# Patient Record
Sex: Male | Born: 1965
Health system: Southern US, Community
[De-identification: ages and names within clinical notes are randomized; demographics above are authoritative.]

## PROBLEM LIST (undated history)

## (undated) DIAGNOSIS — M5126 Other intervertebral disc displacement, lumbar region: Secondary | ICD-10-CM

## (undated) DIAGNOSIS — G8929 Other chronic pain: Secondary | ICD-10-CM

## (undated) DIAGNOSIS — I10 Essential (primary) hypertension: Secondary | ICD-10-CM

## (undated) DIAGNOSIS — M1990 Unspecified osteoarthritis, unspecified site: Secondary | ICD-10-CM

## (undated) DIAGNOSIS — M549 Dorsalgia, unspecified: Secondary | ICD-10-CM

## (undated) DIAGNOSIS — IMO0002 Reserved for concepts with insufficient information to code with codable children: Secondary | ICD-10-CM

## (undated) DIAGNOSIS — M329 Systemic lupus erythematosus, unspecified: Secondary | ICD-10-CM

## (undated) DIAGNOSIS — D759 Disease of blood and blood-forming organs, unspecified: Secondary | ICD-10-CM

## (undated) DIAGNOSIS — K859 Acute pancreatitis without necrosis or infection, unspecified: Secondary | ICD-10-CM

## (undated) DIAGNOSIS — G459 Transient cerebral ischemic attack, unspecified: Secondary | ICD-10-CM

## (undated) HISTORY — PX: HERNIA REPAIR: SHX51

## (undated) HISTORY — PX: LUMBAR EPIDURAL INJECTION: SHX1980

---

## 2003-01-01 ENCOUNTER — Emergency Department (HOSPITAL_COMMUNITY): Admission: EM | Admit: 2003-01-01 | Discharge: 2003-01-01 | Payer: Self-pay | Admitting: Emergency Medicine

## 2005-10-22 ENCOUNTER — Encounter: Admission: RE | Admit: 2005-10-22 | Discharge: 2005-10-22 | Payer: Self-pay | Admitting: Occupational Medicine

## 2006-03-07 ENCOUNTER — Emergency Department: Payer: Self-pay | Admitting: Emergency Medicine

## 2007-08-29 ENCOUNTER — Emergency Department (HOSPITAL_COMMUNITY): Admission: EM | Admit: 2007-08-29 | Discharge: 2007-08-29 | Payer: Self-pay | Admitting: Emergency Medicine

## 2009-06-15 ENCOUNTER — Emergency Department (HOSPITAL_COMMUNITY): Admission: EM | Admit: 2009-06-15 | Discharge: 2009-06-15 | Payer: Self-pay | Admitting: Emergency Medicine

## 2009-08-13 ENCOUNTER — Ambulatory Visit (HOSPITAL_COMMUNITY)
Admission: RE | Admit: 2009-08-13 | Discharge: 2009-08-13 | Payer: Self-pay | Source: Home / Self Care | Admitting: Family Medicine

## 2009-10-10 ENCOUNTER — Ambulatory Visit: Payer: Self-pay

## 2009-11-15 ENCOUNTER — Emergency Department (HOSPITAL_COMMUNITY): Admission: EM | Admit: 2009-11-15 | Discharge: 2009-11-15 | Payer: Self-pay | Admitting: Emergency Medicine

## 2009-11-20 ENCOUNTER — Emergency Department: Payer: Self-pay | Admitting: Emergency Medicine

## 2009-12-19 ENCOUNTER — Observation Stay (HOSPITAL_COMMUNITY): Admission: EM | Admit: 2009-12-19 | Discharge: 2009-12-19 | Payer: Self-pay | Admitting: Emergency Medicine

## 2009-12-23 ENCOUNTER — Telehealth (INDEPENDENT_AMBULATORY_CARE_PROVIDER_SITE_OTHER): Payer: Self-pay | Admitting: *Deleted

## 2009-12-24 ENCOUNTER — Ambulatory Visit: Payer: Self-pay

## 2009-12-24 ENCOUNTER — Encounter: Payer: Self-pay | Admitting: Cardiology

## 2009-12-24 ENCOUNTER — Encounter (HOSPITAL_COMMUNITY)
Admission: RE | Admit: 2009-12-24 | Discharge: 2010-01-02 | Payer: Self-pay | Source: Home / Self Care | Admitting: Cardiology

## 2009-12-24 ENCOUNTER — Ambulatory Visit: Payer: Self-pay | Admitting: Cardiology

## 2010-01-01 DIAGNOSIS — I2510 Atherosclerotic heart disease of native coronary artery without angina pectoris: Secondary | ICD-10-CM | POA: Insufficient documentation

## 2010-01-01 DIAGNOSIS — R9439 Abnormal result of other cardiovascular function study: Secondary | ICD-10-CM | POA: Insufficient documentation

## 2010-01-12 ENCOUNTER — Ambulatory Visit (HOSPITAL_COMMUNITY): Admission: RE | Admit: 2010-01-12 | Discharge: 2010-01-12 | Payer: Self-pay | Admitting: Cardiology

## 2010-01-12 ENCOUNTER — Ambulatory Visit: Payer: Self-pay

## 2010-01-12 ENCOUNTER — Encounter: Payer: Self-pay | Admitting: Cardiology

## 2010-01-12 ENCOUNTER — Ambulatory Visit: Payer: Self-pay | Admitting: Cardiology

## 2010-04-28 NOTE — Assessment & Plan Note (Signed)
Summary: Cardiology Nuclear Testing  Nuclear Med Background Indications for Stress Test: Evaluation for Ischemia, Post Hospital, Abnormal EKG  Indications Comments: 12/19/09 [facility] with CP, Abn EKG and neg enzymes.    Symptoms: Chest Pain, Chest Tightness, Diaphoresis, DOE, Fatigue, Fatigue with Exertion, Light-Headedness, Nausea, Near Syncope, Rapid HR  Symptoms Comments: L arm numbness. Last CP yesterday.   Nuclear Pre-Procedure Cardiac Risk Factors: Hypertension, Smoker Caffeine/Decaff Intake: NONE NPO After: 9:00 PM Lungs: Clear IV 0.9% NS with Angio Cath: 22g     IV Site: R Hand IV Started by: Cathlyn Parsons, RN Chest Size (in) 46-48     Height (in): 70 Weight (lb): 176 BMI: 25.34  Nuclear Med Study 1 or 2 day study:  1 day     Stress Test Type:  Treadmill/Lexiscan Reading MD:  Willa Rough, MD     Referring MD:  J. Hochrein Resting Radionuclide:  Technetium 49m Tetrofosmin     Resting Radionuclide Dose:  11.0 mCi  Stress Radionuclide:  Technetium 43m Tetrofosmin     Stress Radionuclide Dose:  32.0 mCi   Stress Protocol      Max HR:  118 bpm     Predicted Max HR:  178 bpm  Max Systolic BP: 180 mm Hg     Percent Max HR:  66.29 %Rate Pressure Product:  57846  Lexiscan: 0.4 mg   Stress Test Technologist:  Irean Hong,  RN     Nuclear Technologist:  Domenic Polite, CNMT  Rest Procedure  Myocardial perfusion imaging was performed at rest 45 minutes following the intravenous administration of Technetium 29m Tetrofosmin.  Stress Procedure  The patient received IV Lexiscan 0.4 mg over 15-seconds with concurrent low level exercise and then Technetium 48m Tetrofosmin was injected at 30-seconds while the patient continued walking one more minute. There were baseline T wave changes, but  no significant changes with Lexiscan.  Quantitative spect images were obtained after a 45 minute delay.  QPS Raw Data Images:  Patient motion noted; appropriate software correction  applied. Stress Images:  Normal homogeneous uptake in all areas of the myocardium. Rest Images:  Normal homogeneous uptake in all areas of the myocardium. Subtraction (SDS):  No evidence of ischemia. Transient Ischemic Dilatation:  1.05  (Normal <1.22)  Lung/Heart Ratio:  .25  (Normal <0.45)  Quantitative Gated Spect Images QGS EDV:  166 ml QGS ESV:  88 ml QGS EF:  47 % QGS cine images:  Some decreased motion of the septum  Findings Low risk nuclear study      Overall Impression  Exercise Capacity: Lexiscan with low level exercise BP Response: Normal blood pressure response. Clinical Symptoms: SOB ECG Impression: No significant ST segment change suggestive of ischemia. Overall Impression Comments: There is no scar or ischemia. There is mild reduction of the EF.  Appended Document: Nuclear  needs echo EF 47% No ischemia but he needs an echo for the slightly reduced EF.  Appended Document: Cardiology Nuclear Testing pt aware of the results and aware he will be called for an appt for a 2 D Echo

## 2010-04-28 NOTE — Progress Notes (Signed)
Summary: nuc pre procedure  Phone Note Outgoing Call Call back at Home Phone [phone]   Call placed by: Cathlyn Parsons RN,  December 23, 2009 3:33 PM Call placed to: Patient Reason for Call: Confirm/change Appt Summary of Call: Reviewed information on [employer] Information Sheet (see scanned document for further details).  Spoke with patient.      Nuclear Med Background Indications for Stress Test: Evaluation for Ischemia, Post Hospital, Abnormal EKG  Indications Comments: 12/19/09 [facility] with CP, Abn EKG and neg enzymes.    Symptoms: Chest Tightness, Diaphoresis  Symptoms Comments: L arm numbness   Nuclear Pre-Procedure Cardiac Risk Factors: Hypertension, Smoker

## 2010-04-28 NOTE — Miscellaneous (Signed)
Summary: 2 D echo order  Clinical Lists Changes  Problems: Added new problem of LEFT VENTRICULAR FUNCTION, DECREASED (ICD-429.2) Added new problem of OTH NONSPECIFIC ABNORM CV SYSTEM FUNCTION STUDY (ICD-794.39) Orders: Added new Referral order of Echocardiogram (Echo) - Signed

## 2010-06-11 LAB — POCT I-STAT, CHEM 8
Calcium, Ion: 1.22 mmol/L (ref 1.12–1.32)
Creatinine, Ser: 1.1 mg/dL (ref 0.4–1.5)
Glucose, Bld: 142 mg/dL — ABNORMAL HIGH (ref 70–99)
Potassium: 4.1 mEq/L (ref 3.5–5.1)
Sodium: 137 mEq/L (ref 135–145)

## 2010-06-11 LAB — COMPREHENSIVE METABOLIC PANEL
AST: 28 U/L (ref 0–37)
Albumin: 4.1 g/dL (ref 3.5–5.2)
BUN: 11 mg/dL (ref 6–23)
Calcium: 9.5 mg/dL (ref 8.4–10.5)
Chloride: 99 mEq/L (ref 96–112)
GFR calc Af Amer: >60 mL/min (ref >60)

## 2010-06-11 LAB — CK TOTAL AND CKMB (NOT AT ARMC)
CK, MB: 1.4 ng/mL (ref 0.3–4.0)
Relative Index: 0.5 (ref 0.0–2.5)

## 2010-06-11 LAB — CBC
MCH: 30.9 pg (ref 26.0–34.0)
MCHC: 34.5 g/dL (ref 30.0–36.0)
MCV: 86.7 fL (ref 78.0–100.0)
MCV: 88.7 fL (ref 78.0–100.0)
Platelets: 274 10*3/uL (ref 150–400)
RDW: 12.8 % (ref 11.5–15.5)
WBC: 8.1 10*3/uL (ref 4.0–10.5)

## 2010-06-11 LAB — DIFFERENTIAL
Basophils Absolute: 0 10*3/uL (ref 0.0–0.1)
Basophils Relative: 1 % (ref 0–1)
Eosinophils Absolute: 0 10*3/uL (ref 0.0–0.7)
Monocytes Absolute: 0.4 10*3/uL (ref 0.1–1.0)
Neutro Abs: 6.2 10*3/uL (ref 1.7–7.7)
Neutrophils Relative %: 76 % (ref 43–77)

## 2010-06-11 LAB — CARDIAC PANEL(CRET KIN+CKTOT+MB+TROPI)
CK, MB: 1.6 ng/mL (ref 0.3–4.0)
Relative Index: 0.7 (ref 0.0–2.5)
Total CK: 233 U/L — ABNORMAL HIGH (ref 7–232)
Troponin I: 0.01 ng/mL (ref 0.00–0.06)

## 2010-06-11 LAB — POCT CARDIAC MARKERS
CKMB, poc: <1 ng/mL — ABNORMAL LOW (ref 1.0–8.0)
CKMB, poc: <1 ng/mL — ABNORMAL LOW (ref 1.0–8.0)
Myoglobin, poc: 54 ng/mL (ref 12–200)
Myoglobin, poc: 68.6 ng/mL (ref 12–200)
Troponin i, poc: <0.05 ng/mL (ref 0.00–0.09)

## 2010-06-11 LAB — PROTIME-INR: Prothrombin Time: 12.6 seconds (ref 11.6–15.2)

## 2010-06-11 LAB — D-DIMER, QUANTITATIVE: D-Dimer, Quant: <0.22 ug/mL-FEU (ref 0.00–0.48)

## 2010-06-22 LAB — POCT I-STAT, CHEM 8
Calcium, Ion: 1.11 mmol/L — ABNORMAL LOW (ref 1.12–1.32)
Glucose, Bld: 90 mg/dL (ref 70–99)
HCT: 45 % (ref 39.0–52.0)
Hemoglobin: 15.3 g/dL (ref 13.0–17.0)
Sodium: 141 mEq/L (ref 135–145)
TCO2: 30 mmol/L (ref 0–100)

## 2010-06-22 LAB — POCT CARDIAC MARKERS: Myoglobin, poc: 59 ng/mL (ref 12–200)

## 2010-07-29 ENCOUNTER — Telehealth: Payer: Self-pay | Admitting: Cardiology

## 2010-07-29 NOTE — Telephone Encounter (Signed)
Echo,Stress,12 faxed to [facility] @ [phone]  07/29/10/km

## 2011-09-27 ENCOUNTER — Ambulatory Visit: Payer: Self-pay

## 2011-10-24 ENCOUNTER — Emergency Department: Payer: Self-pay | Admitting: *Deleted

## 2011-12-26 ENCOUNTER — Emergency Department: Payer: Self-pay | Admitting: Emergency Medicine

## 2012-02-06 ENCOUNTER — Emergency Department: Payer: Self-pay | Admitting: Emergency Medicine

## 2012-09-09 ENCOUNTER — Emergency Department: Payer: Self-pay | Admitting: Internal Medicine

## 2012-09-09 LAB — CBC WITH DIFFERENTIAL/PLATELET
Basophil %: 1.3 %
Eosinophil #: 0.3 10*3/uL (ref 0.0–0.7)
HGB: 13.7 g/dL (ref 13.0–18.0)
MCH: 30.3 pg (ref 26.0–34.0)
MCHC: 34.6 g/dL (ref 32.0–36.0)
Monocyte #: 0.7 x10 3/mm (ref 0.2–1.0)
Neutrophil #: 1.7 10*3/uL (ref 1.4–6.5)
Neutrophil %: 39.3 %
Platelet: 304 10*3/uL (ref 150–440)
RBC: 4.52 10*6/uL (ref 4.40–5.90)
RDW: 13.3 % (ref 11.5–14.5)
WBC: 4.4 10*3/uL (ref 3.8–10.6)

## 2013-02-26 ENCOUNTER — Emergency Department: Payer: Self-pay | Admitting: Emergency Medicine

## 2013-02-26 LAB — CBC WITH DIFFERENTIAL/PLATELET
Basophil #: 0 10*3/uL (ref 0.0–0.1)
Basophil %: 0.4 %
Eosinophil #: 0.1 10*3/uL (ref 0.0–0.7)
HCT: 45.6 % (ref 40.0–52.0)
Lymphocyte %: 5.2 %
MCH: 31 pg (ref 26.0–34.0)
MCHC: 34.5 g/dL (ref 32.0–36.0)
Monocyte %: 5.9 %
Neutrophil #: 8.9 10*3/uL — ABNORMAL HIGH (ref 1.4–6.5)
Platelet: 327 10*3/uL (ref 150–440)
RBC: 5.07 10*6/uL (ref 4.40–5.90)

## 2013-02-26 LAB — URINALYSIS, COMPLETE
Bilirubin,UR: NEGATIVE
Blood: NEGATIVE
Glucose,UR: NEGATIVE mg/dL (ref 0–75)
Nitrite: NEGATIVE
Ph: 5 (ref 4.5–8.0)
Protein: NEGATIVE
RBC,UR: NONE SEEN /HPF (ref 0–5)
Squamous Epithelial: 1

## 2013-02-26 LAB — COMPREHENSIVE METABOLIC PANEL
Albumin: 3.7 g/dL (ref 3.4–5.0)
Bilirubin,Total: 0.7 mg/dL (ref 0.2–1.0)
Calcium, Total: 9.2 mg/dL (ref 8.5–10.1)
Co2: 28 mmol/L (ref 21–32)
Creatinine: 1.07 mg/dL (ref 0.60–1.30)
EGFR (Non-African Amer.): >60
Glucose: 108 mg/dL — ABNORMAL HIGH (ref 65–99)
Osmolality: 276 (ref 275–301)
Potassium: 3.8 mmol/L (ref 3.5–5.1)
SGOT(AST): 37 U/L (ref 15–37)

## 2013-02-26 LAB — LIPASE, BLOOD: Lipase: 516 U/L — ABNORMAL HIGH (ref 73–393)

## 2013-02-27 LAB — AMYLASE: Amylase: 124 U/L — ABNORMAL HIGH (ref 25–115)

## 2013-06-18 ENCOUNTER — Emergency Department (HOSPITAL_COMMUNITY)
Admission: EM | Admit: 2013-06-18 | Discharge: 2013-06-19 | Disposition: A | Discharge status: Home / Self Care | Payer: Self-pay | Attending: Emergency Medicine | Admitting: Emergency Medicine

## 2013-06-18 ENCOUNTER — Encounter (HOSPITAL_COMMUNITY): Payer: Self-pay | Admitting: Emergency Medicine

## 2013-06-18 DIAGNOSIS — M549 Dorsalgia, unspecified: Secondary | ICD-10-CM

## 2013-06-18 DIAGNOSIS — R519 Headache, unspecified: Secondary | ICD-10-CM

## 2013-06-18 DIAGNOSIS — F17200 Nicotine dependence, unspecified, uncomplicated: Secondary | ICD-10-CM | POA: Insufficient documentation

## 2013-06-18 DIAGNOSIS — G8929 Other chronic pain: Secondary | ICD-10-CM | POA: Insufficient documentation

## 2013-06-18 DIAGNOSIS — I10 Essential (primary) hypertension: Secondary | ICD-10-CM | POA: Insufficient documentation

## 2013-06-18 DIAGNOSIS — Z79899 Other long term (current) drug therapy: Secondary | ICD-10-CM | POA: Insufficient documentation

## 2013-06-18 DIAGNOSIS — R51 Headache: Secondary | ICD-10-CM | POA: Insufficient documentation

## 2013-06-18 DIAGNOSIS — M545 Low back pain, unspecified: Secondary | ICD-10-CM | POA: Insufficient documentation

## 2013-06-18 HISTORY — DX: Other chronic pain: G89.29

## 2013-06-18 HISTORY — DX: Essential (primary) hypertension: I10

## 2013-06-18 HISTORY — DX: Dorsalgia, unspecified: M54.9

## 2013-06-18 NOTE — ED Notes (Signed)
Family member updated on wait time.

## 2013-06-18 NOTE — ED Notes (Signed)
Pt. Reports chronic low back pain for several years and elevated blood pressure , pt. Is not taking antihypertensive medication , pt. also stated bilateral eyes pressure .

## 2013-06-19 LAB — CBC WITH DIFFERENTIAL/PLATELET
BASOS PCT: 1 % (ref 0–1)
Basophils Absolute: 0.1 10*3/uL (ref 0.0–0.1)
EOS ABS: 0.3 10*3/uL (ref 0.0–0.7)
Eosinophils Relative: 6 % — ABNORMAL HIGH (ref 0–5)
HCT: 41.5 % (ref 39.0–52.0)
Hemoglobin: 14.8 g/dL (ref 13.0–17.0)
Lymphocytes Relative: 32 % (ref 12–46)
Lymphs Abs: 1.6 10*3/uL (ref 0.7–4.0)
MCH: 31.2 pg (ref 26.0–34.0)
MCHC: 35.7 g/dL (ref 30.0–36.0)
MCV: 87.4 fL (ref 78.0–100.0)
Monocytes Absolute: 0.7 10*3/uL (ref 0.1–1.0)
Monocytes Relative: 13 % — ABNORMAL HIGH (ref 3–12)
NEUTROS ABS: 2.4 10*3/uL (ref 1.7–7.7)
NEUTROS PCT: 48 % (ref 43–77)
PLATELETS: 297 10*3/uL (ref 150–400)
RBC: 4.75 MIL/uL (ref 4.22–5.81)
RDW: 13 % (ref 11.5–15.5)
WBC: 5 10*3/uL (ref 4.0–10.5)

## 2013-06-19 LAB — I-STAT CHEM 8, ED
BUN: 8 mg/dL (ref 6–23)
CREATININE: 1.1 mg/dL (ref 0.50–1.35)
Calcium, Ion: 1.19 mmol/L (ref 1.12–1.23)
Chloride: 100 mEq/L (ref 96–112)
GLUCOSE: 89 mg/dL (ref 70–99)
HEMATOCRIT: 44 % (ref 39.0–52.0)
HEMOGLOBIN: 15 g/dL (ref 13.0–17.0)
Potassium: 4.1 mEq/L (ref 3.7–5.3)
SODIUM: 138 meq/L (ref 137–147)
TCO2: 27 mmol/L (ref 0–100)

## 2013-06-19 MED ORDER — SODIUM CHLORIDE 0.9 % IV SOLN
INTRAVENOUS | Status: DC
  Administered 2013-06-19: 02:00:00 via INTRAVENOUS

## 2013-06-19 MED ORDER — METOCLOPRAMIDE HCL 5 MG/ML IJ SOLN
10.0000 mg | Freq: Once | INTRAMUSCULAR | Status: AC
  Administered 2013-06-19: 10 mg via INTRAVENOUS
  Filled 2013-06-19: qty 2

## 2013-06-19 MED ORDER — HYDROCHLOROTHIAZIDE 25 MG PO TABS
25.0000 mg | ORAL_TABLET | Freq: Every day | ORAL | Status: DC
  Administered 2013-06-19: 25 mg via ORAL
  Filled 2013-06-19: qty 1

## 2013-06-19 MED ORDER — DIPHENHYDRAMINE HCL 50 MG/ML IJ SOLN
25.0000 mg | Freq: Once | INTRAMUSCULAR | Status: AC
  Administered 2013-06-19: 25 mg via INTRAVENOUS
  Filled 2013-06-19: qty 1

## 2013-06-19 MED ORDER — KETOROLAC TROMETHAMINE 15 MG/ML IJ SOLN
15.0000 mg | Freq: Once | INTRAMUSCULAR | Status: AC
  Administered 2013-06-19: 15 mg via INTRAVENOUS
  Filled 2013-06-19: qty 1

## 2013-06-19 MED ORDER — HYDROCODONE-ACETAMINOPHEN 5-325 MG PO TABS: 1.0000 | ORAL_TABLET | Freq: Four times a day (QID) | ORAL | Status: DC | PRN

## 2013-06-19 MED ORDER — HYDROCHLOROTHIAZIDE 25 MG PO TABS
25.0000 mg | ORAL_TABLET | Freq: Every day | ORAL | Status: DC
Start: 2013-06-19 — End: 2015-11-07

## 2013-06-19 NOTE — ED Provider Notes (Signed)
CSN: [id number]     Arrival date & time 06/18/13  1904 History   First MD Initiated Contact with Patient 06/19/13 0046     Chief Complaint  Patient presents with  . Back Pain     (Consider location/radiation/quality/duration/timing/severity/associated sxs/prior Treatment) HPI This 48 year old male with a history of systemic lupus erythematosus on chronic methotrexate and folic acid. He is followed for this in [place]. He has a history of chronic low back pain but has not had treatment for this in several years. He is here with throbbing in his lower back which she states is radiating to his head causing him to have headache. He is very vague about when this started and when it became urgent enough to come to the ED. He describes the headache as throbbing and wrapping from his occipital region up towards his eyes. He has a history of hypertension but is not on any antihypertensives. He states he is having some mild shortness of breath. He has some upper chest wall tenderness. He denies any focal neurologic deficits. He has chronic skin pigmentation changes due to his SLE.  Past Medical History  Diagnosis Date  . Hypertension   . Back pain, chronic    Past Surgical History  Procedure Laterality Date  . Hernia repair     No family history on file. History  Substance Use Topics  . Smoking status: Current Every Day Smoker  . Smokeless tobacco: Not on file  . Alcohol Use: No    Review of Systems  All other systems reviewed and are negative.   Allergies  Review of patient's allergies indicates no known allergies.  Home Medications   Current Outpatient Rx  Name  Route  Sig  Dispense  Refill  . FOLIC ACID PO   Oral   Take 1 tablet by mouth daily.         . Methotrexate Sodium (METHOTREXATE PO)   Oral   Take 1 tablet by mouth once a week. Pt takes on fridays          BP 153/95  Pulse 60  Temp(Src) 97.8 F (36.6 C) (Oral)  Resp 18  Ht 5\' 10"  (1.778 m)  Wt 180 lb  (81.647 kg)  BMI 25.83 kg/m2  SpO2 99%  Physical Exam General: Well-developed, well-nourished male in no acute distress; appearance consistent with age of record HENT: normocephalic; atraumatic Eyes: pupils equal, round and reactive to light; extraocular muscles intact Neck: supple Heart: regular rate and rhythm; no murmurs Lungs: clear to auscultation bilaterally Chest: Upper sternal tenderness Abdomen: soft; nondistended; nontender; bowel sounds present Extremities: No deformity; full range of motion; pulses normal Neurologic: Awake, alert and oriented; motor function intact in all extremities and symmetric; no facial droop Skin: Warm and dry; patchy pigmentation changes Psychiatric: Normal mood and affect    ED Course  Procedures (including critical care time)     MDM   Nursing notes and vitals signs, including pulse oximetry, reviewed.  Summary of this visit's results, reviewed by myself:  Labs:  Results for orders placed during the hospital encounter of 06/18/13 (from the past 24 hour(s))  CBC WITH DIFFERENTIAL     Status: Abnormal   Collection Time    06/19/13  1:01 AM      Result Value Ref Range   WBC 5.0  4.0 - 10.5 K/uL   RBC 4.75  4.22 - 5.81 MIL/uL   Hemoglobin 14.8  13.0 - 17.0 g/dL   HCT 41.5  39.0 -  52.0 %   MCV 87.4  78.0 - 100.0 fL   MCH 31.2  26.0 - 34.0 pg   MCHC 35.7  30.0 - 36.0 g/dL   RDW 13.0  11.5 - 15.5 %   Platelets 297  150 - 400 K/uL   Neutrophils Relative % 48  43 - 77 %   Neutro Abs 2.4  1.7 - 7.7 K/uL   Lymphocytes Relative 32  12 - 46 %   Lymphs Abs 1.6  0.7 - 4.0 K/uL   Monocytes Relative 13 (*) 3 - 12 %   Monocytes Absolute 0.7  0.1 - 1.0 K/uL   Eosinophils Relative 6 (*) 0 - 5 %   Eosinophils Absolute 0.3  0.0 - 0.7 K/uL   Basophils Relative 1  0 - 1 %   Basophils Absolute 0.1  0.0 - 0.1 K/uL  I-STAT CHEM 8, ED     Status: None   Collection Time    06/19/13  1:15 AM      Result Value Ref Range   Sodium 138  137 - 147 mEq/L    Potassium 4.1  3.7 - 5.3 mEq/L   Chloride 100  96 - 112 mEq/L   BUN 8  6 - 23 mg/dL   Creatinine, Ser 1.10  0.50 - 1.35 mg/dL   Glucose, Bld 89  70 - 99 mg/dL   Calcium, Ion 1.19  1.12 - 1.23 mmol/L   TCO2 27  0 - 100 mmol/L   Hemoglobin 15.0  13.0 - 17.0 g/dL   HCT 44.0  39.0 - 52.0 %   3:23 AM Patient feels better after IV medications. No evidence of endorgan damage at this time. We'll start him on hydrochlorothiazide and refer him back to his primary care physician in [place].      Wynetta Fines, MD 06/19/13 [phone]

## 2014-02-02 ENCOUNTER — Emergency Department (HOSPITAL_COMMUNITY)
Admission: EM | Admit: 2014-02-02 | Discharge: 2014-02-02 | Disposition: A | Discharge status: Home / Self Care | Payer: Medicaid Other | Attending: Emergency Medicine | Admitting: Emergency Medicine

## 2014-02-02 ENCOUNTER — Encounter (HOSPITAL_COMMUNITY): Payer: Self-pay | Admitting: *Deleted

## 2014-02-02 ENCOUNTER — Emergency Department (HOSPITAL_COMMUNITY): Payer: Medicaid Other

## 2014-02-02 DIAGNOSIS — Z720 Tobacco use: Secondary | ICD-10-CM | POA: Insufficient documentation

## 2014-02-02 DIAGNOSIS — R1013 Epigastric pain: Secondary | ICD-10-CM | POA: Insufficient documentation

## 2014-02-02 DIAGNOSIS — R079 Chest pain, unspecified: Secondary | ICD-10-CM | POA: Diagnosis present

## 2014-02-02 DIAGNOSIS — Z79899 Other long term (current) drug therapy: Secondary | ICD-10-CM | POA: Insufficient documentation

## 2014-02-02 DIAGNOSIS — G8929 Other chronic pain: Secondary | ICD-10-CM | POA: Diagnosis not present

## 2014-02-02 DIAGNOSIS — Z862 Personal history of diseases of the blood and blood-forming organs and certain disorders involving the immune mechanism: Secondary | ICD-10-CM | POA: Diagnosis not present

## 2014-02-02 DIAGNOSIS — I10 Essential (primary) hypertension: Secondary | ICD-10-CM | POA: Insufficient documentation

## 2014-02-02 DIAGNOSIS — R0789 Other chest pain: Secondary | ICD-10-CM | POA: Diagnosis not present

## 2014-02-02 DIAGNOSIS — R52 Pain, unspecified: Secondary | ICD-10-CM

## 2014-02-02 HISTORY — DX: Disease of blood and blood-forming organs, unspecified: D75.9

## 2014-02-02 LAB — CBC
HCT: 37.7 % — ABNORMAL LOW (ref 39.0–52.0)
HEMOGLOBIN: 13.4 g/dL (ref 13.0–17.0)
MCH: 30.1 pg (ref 26.0–34.0)
MCHC: 35.5 g/dL (ref 30.0–36.0)
MCV: 84.7 fL (ref 78.0–100.0)
Platelets: 271 10*3/uL (ref 150–400)
RBC: 4.45 MIL/uL (ref 4.22–5.81)
RDW: 11.7 % (ref 11.5–15.5)
WBC: 4.2 10*3/uL (ref 4.0–10.5)

## 2014-02-02 LAB — COMPREHENSIVE METABOLIC PANEL
ALT: 39 U/L (ref 0–53)
ANION GAP: 12 (ref 5–15)
AST: 32 U/L (ref 0–37)
Albumin: 3.7 g/dL (ref 3.5–5.2)
Alkaline Phosphatase: 65 U/L (ref 39–117)
BILIRUBIN TOTAL: 0.3 mg/dL (ref 0.3–1.2)
BUN: 11 mg/dL (ref 6–23)
CHLORIDE: 104 meq/L (ref 96–112)
CO2: 26 meq/L (ref 19–32)
Calcium: 9.4 mg/dL (ref 8.4–10.5)
Creatinine, Ser: 1.24 mg/dL (ref 0.50–1.35)
GFR, EST AFRICAN AMERICAN: 79 mL/min — AB (ref >90)
GFR, EST NON AFRICAN AMERICAN: 68 mL/min — AB (ref >90)
Glucose, Bld: 93 mg/dL (ref 70–99)
POTASSIUM: 4.1 meq/L (ref 3.7–5.3)
SODIUM: 142 meq/L (ref 137–147)
Total Protein: 6.6 g/dL (ref 6.0–8.3)

## 2014-02-02 LAB — TROPONIN I
Troponin I: <0.3 ng/mL (ref <0.30)
Troponin I: <0.3 ng/mL (ref <0.30)

## 2014-02-02 LAB — LIPASE, BLOOD: Lipase: 30 U/L (ref 11–59)

## 2014-02-02 LAB — I-STAT TROPONIN, ED: TROPONIN I, POC: 0 ng/mL (ref 0.00–0.08)

## 2014-02-02 MED ORDER — PANTOPRAZOLE SODIUM 20 MG PO TBEC: 20.0000 mg | DELAYED_RELEASE_TABLET | Freq: Every day | ORAL | Status: DC

## 2014-02-02 MED ORDER — HYDROCODONE-ACETAMINOPHEN 5-325 MG PO TABS: 1.0000 | ORAL_TABLET | Freq: Four times a day (QID) | ORAL | Status: DC | PRN

## 2014-02-02 MED ORDER — TRAMADOL HCL 50 MG PO TABS
50.0000 mg | ORAL_TABLET | Freq: Once | ORAL | Status: DC
  Filled 2014-02-02: qty 1

## 2014-02-02 MED ORDER — PANTOPRAZOLE SODIUM 40 MG PO TBEC
40.0000 mg | DELAYED_RELEASE_TABLET | Freq: Every day | ORAL | Status: DC
  Administered 2014-02-02: 40 mg via ORAL
  Filled 2014-02-02: qty 1

## 2014-02-02 MED ORDER — IBUPROFEN 200 MG PO TABS
400.0000 mg | ORAL_TABLET | Freq: Once | ORAL | Status: AC
  Administered 2014-02-02: 400 mg via ORAL
  Filled 2014-02-02: qty 2

## 2014-02-02 MED ORDER — HYDROCODONE-ACETAMINOPHEN 5-325 MG PO TABS
2.0000 | ORAL_TABLET | Freq: Once | ORAL | Status: AC
  Administered 2014-02-02: 2 via ORAL
  Filled 2014-02-02: qty 2

## 2014-02-02 NOTE — ED Provider Notes (Signed)
CSN: [id number]     Arrival date & time 02/02/14  0930 History   First MD Initiated Contact with Patient 02/02/14 1007     Chief Complaint  Patient presents with  . Abdominal Pain  . Chest Pain     (Consider location/radiation/quality/duration/timing/severity/associated sxs/prior Treatment) Patient is a 48 y.o. male presenting with chest pain. The history is provided by the patient. No language interpreter was used.  Chest Pain Pain location:  Epigastric and substernal area Pain quality: aching   Pain radiates to:  Does not radiate Pain radiates to the back: no   Pain severity:  Moderate Onset quality:  Gradual Duration:  1 week Timing:  Constant Progression:  Worsening Chronicity:  New Relieved by:  Nothing Worsened by:  Nothing tried Associated symptoms: no back pain, no shortness of breath and not vomiting   Risk factors: male sex   Pt reports pain in chest for the past week.   Pt reports discomfort in upper abdominal area as well.  No fever, no chills  Past Medical History  Diagnosis Date  . Hypertension   . Back pain, chronic   . Blood disorder     LPC, treated at [facility]   Past Surgical History  Procedure Laterality Date  . Hernia repair     History reviewed. No pertinent family history. History  Substance Use Topics  . Smoking status: Light Tobacco Smoker    Types: Cigarettes  . Smokeless tobacco: Not on file  . Alcohol Use: No    Review of Systems  Respiratory: Negative for shortness of breath.   Cardiovascular: Positive for chest pain.  Gastrointestinal: Negative for vomiting.  Musculoskeletal: Negative for back pain.  All other systems reviewed and are negative.     Allergies  Review of patient's allergies indicates no known allergies.  Home Medications   Prior to Admission medications   Medication Sig Start Date End Date Taking? Authorizing Provider  FOLIC ACID PO Take 1 tablet by mouth daily.    Historical Provider, MD  hydrochlorothiazide  (HYDRODIURIL) 25 MG tablet Take 1 tablet (25 mg total) by mouth daily. 06/19/13   Karen Chafe Molpus, MD  HYDROcodone-acetaminophen (NORCO) 5-325 MG per tablet Take 1-2 tablets by mouth every 6 (six) hours as needed (for pain). 06/19/13   Karen Chafe Molpus, MD  Methotrexate Sodium (METHOTREXATE PO) Take 1 tablet by mouth once a week. Pt takes on fridays    Historical Provider, MD   BP 173/108 mmHg  Pulse 57  Temp(Src) 97.8 F (36.6 C) (Oral)  Resp 16  SpO2 100% Physical Exam  Constitutional: He appears well-developed and well-nourished.  HENT:  Head: Normocephalic.  Right Ear: External ear normal.  Left Ear: External ear normal.  Mouth/Throat: Oropharynx is clear and moist.  Eyes: Conjunctivae are normal. Pupils are equal, round, and reactive to light.  Neck: Normal range of motion. Neck supple.  Cardiovascular: Normal rate.   Pulmonary/Chest: Effort normal.  Abdominal: Soft.  Musculoskeletal: Normal range of motion.  Neurological: He is alert.  Skin: Skin is warm.  Psychiatric: He has a normal mood and affect.  Nursing note and vitals reviewed.   ED Course  Procedures (including critical care time) Labs Review Labs Reviewed  CBC  LIPASE, BLOOD  COMPREHENSIVE METABOLIC PANEL  TROPONIN I  I-STAT TROPOININ, ED    Imaging Review No results found.   EKG Interpretation   Date/Time:  Saturday February 02 2014 09:40:38 EST Ventricular Rate:  53 PR Interval:  199 QRS Duration:  107 QT Interval:  419 QTC Calculation: 393 R Axis:   29 Text Interpretation:  Sinus rhythm Borderline T wave abnormalities  Baseline wander in lead(s) I II III aVR aVL aVF V3 V6 No significant  change since last tracing Confirmed by Ashok Cordia  MD, Lennette Bihari ([id number]) on  02/02/2014 11:05:26 AM      Results for orders placed or performed during the hospital encounter of 02/02/14  CBC  Result Value Ref Range   WBC 4.2 4.0 - 10.5 K/uL   RBC 4.45 4.22 - 5.81 MIL/uL   Hemoglobin 13.4 13.0 - 17.0 g/dL   HCT 37.7  (L) 39.0 - 52.0 %   MCV 84.7 78.0 - 100.0 fL   MCH 30.1 26.0 - 34.0 pg   MCHC 35.5 30.0 - 36.0 g/dL   RDW 11.7 11.5 - 15.5 %   Platelets 271 150 - 400 K/uL  Lipase, blood  Result Value Ref Range   Lipase 30 11 - 59 U/L  Comprehensive metabolic panel  Result Value Ref Range   Sodium 142 137 - 147 mEq/L   Potassium 4.1 3.7 - 5.3 mEq/L   Chloride 104 96 - 112 mEq/L   CO2 26 19 - 32 mEq/L   Glucose, Bld 93 70 - 99 mg/dL   BUN 11 6 - 23 mg/dL   Creatinine, Ser 1.24 0.50 - 1.35 mg/dL   Calcium 9.4 8.4 - 10.5 mg/dL   Total Protein 6.6 6.0 - 8.3 g/dL   Albumin 3.7 3.5 - 5.2 g/dL   AST 32 0 - 37 U/L   ALT 39 0 - 53 U/L   Alkaline Phosphatase 65 39 - 117 U/L   Total Bilirubin 0.3 0.3 - 1.2 mg/dL   GFR calc non Af Amer 68 (L) >90 mL/min   GFR calc Af Amer 79 (L) >90 mL/min   Anion gap 12 5 - 15  Troponin I  Result Value Ref Range   Troponin I <0.30 <0.30 ng/mL  Troponin I  Result Value Ref Range   Troponin I <0.30 <0.30 ng/mL  I-stat troponin, ED (not at [facility])  Result Value Ref Range   Troponin i, poc 0.00 0.00 - 0.08 ng/mL   Comment 3           Dg Chest 2 View  02/02/2014   CLINICAL DATA:  Chest pain.  EXAM: CHEST  2 VIEW  COMPARISON:  None.  FINDINGS: Normal heart size and mediastinal contours. No acute infiltrate or edema. No effusion or pneumothorax. No acute osseous findings.  IMPRESSION: No active cardiopulmonary disease.   Electronically Signed   By: Jorje Guild M.D.   On: 02/02/2014 11:08     MDM troponin negative x 2.    I will treat with priotonix and hydrocodone for pain.   Pt advised top see his MD for recheck in 2-3 days   Final diagnoses:  Pain  Chest pain, atypical    Hydrocodone protonix   Fransico Meadow, PA-C 02/02/14 [place], MD 02/03/14 [phone]

## 2014-02-02 NOTE — ED Notes (Signed)
Pt reports he is currently being treated for blood disorder LPC at [facility]. Reports central upper abdominal x1 week, reports nausea, denies vomiting. Reports slight SOB, able to speak in full sentences.

## 2014-02-02 NOTE — ED Notes (Addendum)
Pt ambulatory upon DC. PT reports that he is leaving with ALL belongings he arrived with. Pt was advised to follow up with PCP in the next few days. Family driving pt home.

## 2014-02-02 NOTE — Discharge Instructions (Signed)
Abdominal Pain °Many things can cause abdominal pain. Usually, abdominal pain is not caused by a disease and will improve without treatment. It can often be observed and treated at home. Your health care provider will do a physical exam and possibly order blood tests and X-rays to help determine the seriousness of your pain. However, in many cases, more time must pass before a clear cause of the pain can be found. Before that point, your health care provider may not know if you need more testing or further treatment. °HOME CARE INSTRUCTIONS  °Monitor your abdominal pain for any changes. The following actions may help to alleviate any discomfort you are experiencing: °· Only take over-the-counter or prescription medicines as directed by your health care provider. °· Do not take laxatives unless directed to do so by your health care provider. °· Try a clear liquid diet (broth, tea, or water) as directed by your health care provider. Slowly move to a bland diet as tolerated. °SEEK MEDICAL CARE IF: °· You have unexplained abdominal pain. °· You have abdominal pain associated with nausea or diarrhea. °· You have pain when you urinate or have a bowel movement. °· You experience abdominal pain that wakes you in the night. °· You have abdominal pain that is worsened or improved by eating food. °· You have abdominal pain that is worsened with eating fatty foods. °· You have a fever. °SEEK IMMEDIATE MEDICAL CARE IF:  °· Your pain does not go away within 2 hours. °· You keep throwing up (vomiting). °· Your pain is felt only in portions of the abdomen, such as the right side or the left lower portion of the abdomen. °· You pass bloody or black tarry stools. °MAKE SURE YOU: °· Understand these instructions.   °· Will watch your condition.   °· Will get help right away if you are not doing well or get worse.   °Document Released: 12/23/2004 Document Revised: 03/20/2013 Document Reviewed: 11/22/2012 °ExitCare® Patient Information  ©2015 [employer]. This information is not intended to replace advice given to you by your health care provider. Make sure you discuss any questions you have with your health care provider. ° °Chest Pain (Nonspecific) °It is often hard to give a specific diagnosis for the cause of chest pain. There is always a chance that your pain could be related to something serious, such as a heart attack or a blood clot in the lungs. You need to follow up with your health care provider for further evaluation. °CAUSES  °· Heartburn. °· Pneumonia or bronchitis. °· Anxiety or stress. °· Inflammation around your heart (pericarditis) or lung (pleuritis or pleurisy). °· A blood clot in the lung. °· A collapsed lung (pneumothorax). It can develop suddenly on its own (spontaneous pneumothorax) or from trauma to the chest. °· Shingles infection (herpes zoster virus). °The chest wall is composed of bones, muscles, and cartilage. Any of these can be the source of the pain. °· The bones can be bruised by injury. °· The muscles or cartilage can be strained by coughing or overwork. °· The cartilage can be affected by inflammation and become sore (costochondritis). °DIAGNOSIS  °Lab tests or other studies may be needed to find the cause of your pain. Your health care provider may have you take a test called an ambulatory electrocardiogram (ECG). An ECG records your heartbeat patterns over a 24-hour period. You may also have other tests, such as: °· Transthoracic echocardiogram (TTE). During echocardiography, sound waves are used to evaluate how blood   flows through your heart. °· Transesophageal echocardiogram (TEE). °· Cardiac monitoring. This allows your health care provider to monitor your heart rate and rhythm in real time. °· Holter monitor. This is a portable device that records your heartbeat and can help diagnose heart arrhythmias. It allows your health care provider to track your heart activity for several days, if needed. °· Stress  tests by exercise or by giving medicine that makes the heart beat faster. °TREATMENT  °· Treatment depends on what may be causing your chest pain. Treatment may include: °¨ Acid blockers for heartburn. °¨ Anti-inflammatory medicine. °¨ Pain medicine for inflammatory conditions. °¨ Antibiotics if an infection is present. °· You may be advised to change lifestyle habits. This includes stopping smoking and avoiding alcohol, caffeine, and chocolate. °· You may be advised to keep your head raised (elevated) when sleeping. This reduces the chance of acid going backward from your stomach into your esophagus. °Most of the time, nonspecific chest pain will improve within 2-3 days with rest and mild pain medicine.  °HOME CARE INSTRUCTIONS  °· If antibiotics were prescribed, take them as directed. Finish them even if you start to feel better. °· For the next few days, avoid physical activities that bring on chest pain. Continue physical activities as directed. °· Do not use any tobacco products, including cigarettes, chewing tobacco, or electronic cigarettes. °· Avoid drinking alcohol. °· Only take medicine as directed by your health care provider. °· Follow your health care provider's suggestions for further testing if your chest pain does not go away. °· Keep any follow-up appointments you made. If you do not go to an appointment, you could develop lasting (chronic) problems with pain. If there is any problem keeping an appointment, call to reschedule. °SEEK MEDICAL CARE IF:  °· Your chest pain does not go away, even after treatment. °· You have a rash with blisters on your chest. °· You have a fever. °SEEK IMMEDIATE MEDICAL CARE IF:  °· You have increased chest pain or pain that spreads to your arm, neck, jaw, back, or abdomen. °· You have shortness of breath. °· You have an increasing cough, or you cough up blood. °· You have severe back or abdominal pain. °· You feel nauseous or vomit. °· You have severe weakness. °· You  faint. °· You have chills. °This is an emergency. Do not wait to see if the pain will go away. Get medical help at once. Call your local emergency services (911 in [place] Do not drive yourself to the hospital. °MAKE SURE YOU:  °· Understand these instructions. °· Will watch your condition. °· Will get help right away if you are not doing well or get worse. °Document Released: 12/23/2004 Document Revised: 03/20/2013 Document Reviewed: 10/19/2007 °ExitCare® Patient Information ©2015 [employer]. This information is not intended to replace advice given to you by your health care provider. Make sure you discuss any questions you have with your health care provider. ° °

## 2014-03-29 DIAGNOSIS — G459 Transient cerebral ischemic attack, unspecified: Secondary | ICD-10-CM

## 2014-03-29 HISTORY — DX: Transient cerebral ischemic attack, unspecified: G45.9

## 2014-09-12 ENCOUNTER — Ambulatory Visit: Payer: Medicaid Other | Admitting: Anesthesiology

## 2014-09-12 ENCOUNTER — Ambulatory Visit
Admission: RE | Admit: 2014-09-12 | Discharge: 2014-09-12 | Disposition: A | Discharge status: Home / Self Care | Payer: Medicaid Other | Source: Ambulatory Visit | Attending: Gastroenterology | Admitting: Gastroenterology

## 2014-09-12 ENCOUNTER — Encounter: Payer: Self-pay | Admitting: Anesthesiology

## 2014-09-12 ENCOUNTER — Encounter
Admission: RE | Disposition: A | Discharge status: Home / Self Care | Payer: Self-pay | Source: Ambulatory Visit | Attending: Gastroenterology

## 2014-09-12 DIAGNOSIS — M329 Systemic lupus erythematosus, unspecified: Secondary | ICD-10-CM | POA: Insufficient documentation

## 2014-09-12 DIAGNOSIS — K635 Polyp of colon: Secondary | ICD-10-CM | POA: Diagnosis not present

## 2014-09-12 DIAGNOSIS — F17210 Nicotine dependence, cigarettes, uncomplicated: Secondary | ICD-10-CM | POA: Insufficient documentation

## 2014-09-12 DIAGNOSIS — K5909 Other constipation: Secondary | ICD-10-CM | POA: Insufficient documentation

## 2014-09-12 DIAGNOSIS — D122 Benign neoplasm of ascending colon: Secondary | ICD-10-CM | POA: Diagnosis not present

## 2014-09-12 DIAGNOSIS — R195 Other fecal abnormalities: Secondary | ICD-10-CM | POA: Insufficient documentation

## 2014-09-12 DIAGNOSIS — Z79899 Other long term (current) drug therapy: Secondary | ICD-10-CM | POA: Diagnosis not present

## 2014-09-12 DIAGNOSIS — R634 Abnormal weight loss: Secondary | ICD-10-CM | POA: Diagnosis not present

## 2014-09-12 DIAGNOSIS — M069 Rheumatoid arthritis, unspecified: Secondary | ICD-10-CM | POA: Diagnosis not present

## 2014-09-12 DIAGNOSIS — K219 Gastro-esophageal reflux disease without esophagitis: Secondary | ICD-10-CM | POA: Diagnosis not present

## 2014-09-12 DIAGNOSIS — Z6825 Body mass index (BMI) 25.0-25.9, adult: Secondary | ICD-10-CM | POA: Insufficient documentation

## 2014-09-12 DIAGNOSIS — Z8719 Personal history of other diseases of the digestive system: Secondary | ICD-10-CM | POA: Insufficient documentation

## 2014-09-12 DIAGNOSIS — R1013 Epigastric pain: Secondary | ICD-10-CM | POA: Insufficient documentation

## 2014-09-12 DIAGNOSIS — R1310 Dysphagia, unspecified: Secondary | ICD-10-CM | POA: Insufficient documentation

## 2014-09-12 DIAGNOSIS — I10 Essential (primary) hypertension: Secondary | ICD-10-CM | POA: Insufficient documentation

## 2014-09-12 HISTORY — DX: Systemic lupus erythematosus, unspecified: M32.9

## 2014-09-12 HISTORY — PX: ESOPHAGOGASTRODUODENOSCOPY: SHX5428

## 2014-09-12 HISTORY — DX: Acute pancreatitis without necrosis or infection, unspecified: K85.90

## 2014-09-12 HISTORY — PX: COLONOSCOPY WITH PROPOFOL: SHX5780

## 2014-09-12 HISTORY — DX: Unspecified osteoarthritis, unspecified site: M19.90

## 2014-09-12 HISTORY — DX: Reserved for concepts with insufficient information to code with codable children: IMO0002

## 2014-09-12 SURGERY — COLONOSCOPY WITH PROPOFOL
Anesthesia: General

## 2014-09-12 MED ORDER — MIDAZOLAM HCL 2 MG/2ML IJ SOLN
INTRAMUSCULAR | Status: DC | PRN
  Administered 2014-09-12: 2 mg via INTRAVENOUS

## 2014-09-12 MED ORDER — GLYCOPYRROLATE 0.2 MG/ML IJ SOLN
INTRAMUSCULAR | Status: DC | PRN
  Administered 2014-09-12: 0.2 mg via INTRAVENOUS

## 2014-09-12 MED ORDER — EPHEDRINE SULFATE 50 MG/ML IJ SOLN
INTRAMUSCULAR | Status: DC | PRN
  Administered 2014-09-12: 5 mg via INTRAVENOUS

## 2014-09-12 MED ORDER — PROPOFOL 10 MG/ML IV BOLUS
INTRAVENOUS | Status: DC | PRN
  Administered 2014-09-12: 50 mg via INTRAVENOUS

## 2014-09-12 MED ORDER — LIDOCAINE HCL (CARDIAC) 20 MG/ML IV SOLN
INTRAVENOUS | Status: DC | PRN
  Administered 2014-09-12: 60 mg via INTRAVENOUS

## 2014-09-12 MED ORDER — SODIUM CHLORIDE 0.9 % IV SOLN
INTRAVENOUS | Status: DC
  Administered 2014-09-12: 09:00:00 via INTRAVENOUS

## 2014-09-12 MED ORDER — SODIUM CHLORIDE 0.9 % IV SOLN
INTRAVENOUS | Status: DC
  Administered 2014-09-12: 08:00:00 via INTRAVENOUS

## 2014-09-12 MED ORDER — PROPOFOL INFUSION 10 MG/ML OPTIME
INTRAVENOUS | Status: DC | PRN
  Administered 2014-09-12: 150 ug/kg/min via INTRAVENOUS

## 2014-09-12 NOTE — Anesthesia Postprocedure Evaluation (Signed)
  Anesthesia Post-op Note  Patient: Thomas Willis  Procedure(s) Performed: Procedure(s): COLONOSCOPY WITH PROPOFOL (N/A) ESOPHAGOGASTRODUODENOSCOPY (EGD) (N/A)  Anesthesia type:General  Patient location: PACU  Post pain: Pain level controlled  Post assessment: Post-op Vital signs reviewed, Patient's Cardiovascular Status Stable, Respiratory Function Stable, Patent Airway and No signs of Nausea or vomiting  Post vital signs: Reviewed and stable  Last Vitals:  Filed Vitals:   09/12/14 1010  BP: 183/98  Pulse: 45  Temp:   Resp: 24    Level of consciousness: awake, alert  and patient cooperative  Complications: No apparent anesthesia complications

## 2014-09-12 NOTE — Op Note (Signed)
[facility] Gastroenterology Patient Name: Thomas Willis Procedure Date: 09/12/2014 8:54 AM MRN: [id number] Account #: [id number] Date of Birth: [birth year] Admit Type: Outpatient Age: 49 Room: [facility] ENDO ROOM 4 Gender: Male Note Status: Finalized Procedure:         Upper GI endoscopy Indications:       Epigastric abdominal pain, Dysphagia, Suspected esophageal                     reflux, Melena Providers:         Lupita Dawn. Candace Cruise, MD Referring MD:      Forest Gleason Md, MD (Referring MD) Medicines:         Monitored Anesthesia Care Complications:     No immediate complications. Procedure:         Pre-Anesthesia Assessment:                    - Prior to the procedure, a History and Physical was                     performed, and patient medications, allergies and                     sensitivities were reviewed. The patient's tolerance of                     previous anesthesia was reviewed.                    - The risks and benefits of the procedure and the sedation                     options and risks were discussed with the patient. All                     questions were answered and informed consent was obtained.                    - After reviewing the risks and benefits, the patient was                     deemed in satisfactory condition to undergo the procedure.                    After obtaining informed consent, the endoscope was passed                     under direct vision. Throughout the procedure, the                     patient's blood pressure, pulse, and oxygen saturations                     were monitored continuously. The Olympus GIF-160 endoscope                     (S#. [phone]) was introduced through the mouth, and                     advanced to the second part of duodenum. The upper GI                     endoscopy was accomplished without difficulty. The patient  tolerated the procedure well. Findings:      No endoscopic  abnormality was evident in the esophagus to explain the       patient's complaint of dysphagia. It was decided, however, to proceed       with dilation of the entire esophagus. The scope was withdrawn. Dilation       was performed with a Maloney dilator with no resistance at 54 Fr.      The entire examined stomach was normal.      The examined duodenum was normal. Impression:        - No endoscopic esophageal abnormality to explain                     patient's dysphagia. Esophagus dilated. Dilated.                    - Normal stomach.                    - Normal examined duodenum.                    - No specimens collected. Recommendation:    - Discharge patient to home.                    - Observe patient's clinical course.                    - Use Prilosec (omeprazole) 20 mg PO daily for 1 month.                    - The findings and recommendations were discussed with the                     patient. Procedure Code(s): --- Professional ---                    [phone], Esophagogastroduodenoscopy, flexible, transoral;                     diagnostic, including collection of specimen(s) by                     brushing or washing, when performed (separate procedure)                    43450, Dilation of esophagus, by unguided sound or bougie,                     single or multiple passes Diagnosis Code(s): --- Professional ---                    R13.10, Dysphagia, unspecified                    R10.13, Epigastric pain                    K92.1, Melena CPT copyright 2014 American Medical Association. All rights reserved. The codes documented in this report are preliminary and upon coder review may  be revised to meet current compliance requirements. Hulen Luster, MD 09/12/2014 9:04:36 AM This report has been signed electronically. Number of Addenda: 0 Note Initiated On: 09/12/2014 8:54 AM      [facility]

## 2014-09-12 NOTE — Op Note (Signed)
[facility] Gastroenterology Patient Name: Thomas Willis Procedure Date: 09/12/2014 8:49 AM MRN: [id number] Account #: [id number] Date of Birth: May 09, 1965 Admit Type: Outpatient Age: 49 Room: [facility] ENDO ROOM 4 Gender: Male Note Status: Finalized Procedure:         Colonoscopy Indications:       Heme positive stool, Constipation Providers:         Lupita Dawn. Candace Cruise, MD Referring MD:      Forest Gleason Md, MD (Referring MD) Medicines:         Monitored Anesthesia Care Complications:     No immediate complications. Procedure:         Pre-Anesthesia Assessment:                    - Prior to the procedure, a History and Physical was                     performed, and patient medications, allergies and                     sensitivities were reviewed. The patient's tolerance of                     previous anesthesia was reviewed.                    - The risks and benefits of the procedure and the sedation                     options and risks were discussed with the patient. All                     questions were answered and informed consent was obtained.                    - After reviewing the risks and benefits, the patient was                     deemed in satisfactory condition to undergo the procedure.                    After obtaining informed consent, the colonoscope was                     passed under direct vision. Throughout the procedure, the                     patient's blood pressure, pulse, and oxygen saturations                     were monitored continuously. The Colonoscope was                     introduced through the anus and advanced to the the cecum,                     identified by appendiceal orifice and ileocecal valve. The                     colonoscopy was performed without difficulty. The patient                     tolerated the procedure well. The quality of the bowel  preparation was good. Findings:      A small polyp was  found in the ascending colon. The polyp was sessile.       The polyp was removed with a cold snare. Resection and retrieval were       complete.      Five sessile polyps were found in the sigmoid colon. The polyps were       diminutive in size. These polyps were removed with a jumbo cold forceps.       Resection and retrieval were complete.      The exam was otherwise without abnormality. Impression:        - One small polyp in the ascending colon. Resected and                     retrieved.                    - Five diminutive polyps in the sigmoid colon. Resected                     and retrieved.                    - The examination was otherwise normal. Recommendation:    - Discharge patient to home.                    - Await pathology results.                    - Repeat colonoscopy in 5 years for surveillance based on                     pathology results.                    - The findings and recommendations were discussed with the                     patient. Procedure Code(s): --- Professional ---                    [phone], Colonoscopy, flexible; with removal of tumor(s),                     polyp(s), or other lesion(s) by snare technique                    45380, 51, Colonoscopy, flexible; with biopsy, single or                     multiple Diagnosis Code(s): --- Professional ---                    D12.2, Benign neoplasm of ascending colon                    D12.5, Benign neoplasm of sigmoid colon                    R19.5, Other fecal abnormalities                    K59.00, Constipation, unspecified CPT copyright 2014 American Medical Association. All rights reserved. The codes documented in this report are preliminary and upon coder review may  be revised to meet current compliance requirements. Hulen Luster, MD 09/12/2014 9:20:59 AM This report has been signed electronically.  Number of Addenda: 0 Note Initiated On: 09/12/2014 8:49 AM Scope Withdrawal Time: 0 hours 7  minutes 52 seconds  Total Procedure Duration: 0 hours 11 minutes 58 seconds       [facility]

## 2014-09-12 NOTE — Transfer of Care (Signed)
Immediate Anesthesia Transfer of Care Note  Patient: Thomas Willis  Procedure(s) Performed: Procedure(s): COLONOSCOPY WITH PROPOFOL (N/A) ESOPHAGOGASTRODUODENOSCOPY (EGD) (N/A)  Patient Location: Endoscopy Unit  Anesthesia Type:General  Level of Consciousness: awake, alert , oriented and patient cooperative  Airway & Oxygen Therapy: Patient Spontanous Breathing and Patient connected to nasal cannula oxygen  Post-op Assessment: Report given to RN, Post -op Vital signs reviewed and stable and Patient moving all extremities X 4  Post vital signs: Reviewed and stable  Last Vitals:  Filed Vitals:   09/12/14 0928  BP: 111/70  Pulse: 53  Temp: 36.1 C  Resp: 26    Complications: No apparent anesthesia complications

## 2014-09-12 NOTE — Anesthesia Preprocedure Evaluation (Addendum)
Anesthesia Evaluation  Patient identified by MRN, date of birth, ID band  Reviewed: Allergy & Precautions, NPO status , Patient's Chart, lab work & pertinent test results  Airway Mallampati: II  TM Distance: >3 FB     Dental   Pulmonary Current Smoker,          Cardiovascular hypertension, Pt. on medications     Neuro/Psych    GI/Hepatic   Endo/Other    Renal/GU      Musculoskeletal  (+) Arthritis -,   Abdominal   Peds  Hematology   Anesthesia Other Findings   Reproductive/Obstetrics                            Anesthesia Physical Anesthesia Plan  ASA: III  Anesthesia Plan: General   Post-op Pain Management:    Induction: Intravenous  Airway Management Planned: Nasal Cannula  Additional Equipment:   Intra-op Plan:   Post-operative Plan:   Informed Consent: I have reviewed the patients History and Physical, chart, labs and discussed the procedure including the risks, benefits and alternatives for the proposed anesthesia with the patient or authorized representative who has indicated his/her understanding and acceptance.     Plan Discussed with: CRNA  Anesthesia Plan Comments: (Pt. Has a connective tissue disease - lupus?)       Anesthesia Quick Evaluation

## 2014-09-12 NOTE — H&P (Signed)
  Date of Initial H&P:09/04/2014 History reviewed, patient examined, no change in status, stable for surgery.

## 2014-09-13 LAB — SURGICAL PATHOLOGY

## 2014-09-23 ENCOUNTER — Encounter: Payer: Self-pay | Admitting: Gastroenterology

## 2014-11-30 ENCOUNTER — Observation Stay (HOSPITAL_COMMUNITY): Payer: Medicaid Other

## 2014-11-30 ENCOUNTER — Observation Stay (HOSPITAL_COMMUNITY)
Admission: EM | Admit: 2014-11-30 | Discharge: 2014-12-02 | Disposition: A | Discharge status: Home / Self Care | Payer: Medicaid Other | Attending: Internal Medicine | Admitting: Internal Medicine

## 2014-11-30 ENCOUNTER — Emergency Department (HOSPITAL_COMMUNITY): Payer: Medicaid Other

## 2014-11-30 ENCOUNTER — Encounter (HOSPITAL_COMMUNITY): Payer: Self-pay | Admitting: Emergency Medicine

## 2014-11-30 DIAGNOSIS — M351 Other overlap syndromes: Secondary | ICD-10-CM | POA: Diagnosis present

## 2014-11-30 DIAGNOSIS — Z79899 Other long term (current) drug therapy: Secondary | ICD-10-CM | POA: Insufficient documentation

## 2014-11-30 DIAGNOSIS — R0789 Other chest pain: Secondary | ICD-10-CM | POA: Diagnosis not present

## 2014-11-30 DIAGNOSIS — I10 Essential (primary) hypertension: Secondary | ICD-10-CM | POA: Diagnosis present

## 2014-11-30 DIAGNOSIS — M1990 Unspecified osteoarthritis, unspecified site: Secondary | ICD-10-CM | POA: Diagnosis not present

## 2014-11-30 DIAGNOSIS — Z720 Tobacco use: Secondary | ICD-10-CM | POA: Diagnosis present

## 2014-11-30 DIAGNOSIS — R2681 Unsteadiness on feet: Secondary | ICD-10-CM | POA: Insufficient documentation

## 2014-11-30 DIAGNOSIS — G459 Transient cerebral ischemic attack, unspecified: Secondary | ICD-10-CM

## 2014-11-30 DIAGNOSIS — I639 Cerebral infarction, unspecified: Secondary | ICD-10-CM

## 2014-11-30 DIAGNOSIS — F17210 Nicotine dependence, cigarettes, uncomplicated: Secondary | ICD-10-CM | POA: Insufficient documentation

## 2014-11-30 DIAGNOSIS — R200 Anesthesia of skin: Secondary | ICD-10-CM | POA: Diagnosis not present

## 2014-11-30 DIAGNOSIS — R531 Weakness: Secondary | ICD-10-CM

## 2014-11-30 DIAGNOSIS — R4781 Slurred speech: Secondary | ICD-10-CM | POA: Insufficient documentation

## 2014-11-30 DIAGNOSIS — M6289 Other specified disorders of muscle: Secondary | ICD-10-CM | POA: Diagnosis not present

## 2014-11-30 HISTORY — DX: Other intervertebral disc displacement, lumbar region: M51.26

## 2014-11-30 LAB — URINALYSIS, ROUTINE W REFLEX MICROSCOPIC
BILIRUBIN URINE: NEGATIVE
Glucose, UA: NEGATIVE mg/dL
HGB URINE DIPSTICK: NEGATIVE
KETONES UR: NEGATIVE mg/dL
Leukocytes, UA: NEGATIVE
NITRITE: NEGATIVE
PROTEIN: NEGATIVE mg/dL
Specific Gravity, Urine: 1.006 (ref 1.005–1.030)
UROBILINOGEN UA: 0.2 mg/dL (ref 0.0–1.0)
pH: 6.5 (ref 5.0–8.0)

## 2014-11-30 LAB — BASIC METABOLIC PANEL
ANION GAP: 5 (ref 5–15)
BUN: 7 mg/dL (ref 6–20)
CHLORIDE: 104 mmol/L (ref 101–111)
CO2: 30 mmol/L (ref 22–32)
Calcium: 9.6 mg/dL (ref 8.9–10.3)
Creatinine, Ser: 1.1 mg/dL (ref 0.61–1.24)
GFR calc Af Amer: >60 mL/min (ref >60)
GFR calc non Af Amer: >60 mL/min (ref >60)
GLUCOSE: 87 mg/dL (ref 65–99)
POTASSIUM: 4.3 mmol/L (ref 3.5–5.1)
Sodium: 139 mmol/L (ref 135–145)

## 2014-11-30 LAB — DIFFERENTIAL

## 2014-11-30 LAB — CBC
HEMATOCRIT: 42.3 % (ref 39.0–52.0)
Hemoglobin: 14.8 g/dL (ref 13.0–17.0)
MCH: 31.6 pg (ref 26.0–34.0)
MCHC: 35 g/dL (ref 30.0–36.0)
MCV: 90.4 fL (ref 78.0–100.0)
Platelets: 322 10*3/uL (ref 150–400)
RBC: 4.68 MIL/uL (ref 4.22–5.81)
RDW: 12.8 % (ref 11.5–15.5)
WBC: 4.4 10*3/uL (ref 4.0–10.5)

## 2014-11-30 LAB — RAPID URINE DRUG SCREEN, HOSP PERFORMED
AMPHETAMINES: NOT DETECTED
BARBITURATES: NOT DETECTED
Benzodiazepines: NOT DETECTED
Cocaine: NOT DETECTED
OPIATES: NOT DETECTED
TETRAHYDROCANNABINOL: POSITIVE — AB

## 2014-11-30 LAB — ETHANOL

## 2014-11-30 LAB — APTT: aPTT: 37 seconds (ref 24–37)

## 2014-11-30 LAB — CBG MONITORING, ED: GLUCOSE-CAPILLARY: 86 mg/dL (ref 65–99)

## 2014-11-30 LAB — I-STAT TROPONIN, ED: Troponin i, poc: 0 ng/mL (ref 0.00–0.08)

## 2014-11-30 LAB — PROTIME-INR
INR: 0.98 (ref 0.00–1.49)
PROTHROMBIN TIME: 13.2 s (ref 11.6–15.2)

## 2014-11-30 MED ORDER — ENOXAPARIN SODIUM 40 MG/0.4ML ~~LOC~~ SOLN
40.0000 mg | SUBCUTANEOUS | Status: DC
  Administered 2014-11-30 – 2014-12-01 (×2): 40 mg via SUBCUTANEOUS
  Filled 2014-11-30 (×3): qty 0.4

## 2014-11-30 MED ORDER — NICOTINE 14 MG/24HR TD PT24
14.0000 mg | MEDICATED_PATCH | Freq: Every day | TRANSDERMAL | Status: DC
  Administered 2014-11-30 – 2014-12-02 (×3): 14 mg via TRANSDERMAL
  Filled 2014-11-30 (×3): qty 1

## 2014-11-30 MED ORDER — STROKE: EARLY STAGES OF RECOVERY BOOK
Freq: Once | Status: AC
  Administered 2014-11-30: 22:00:00

## 2014-11-30 MED ORDER — ASPIRIN 325 MG PO TABS
325.0000 mg | ORAL_TABLET | Freq: Every day | ORAL | Status: DC
  Administered 2014-11-30 – 2014-12-02 (×3): 325 mg via ORAL
  Filled 2014-11-30 (×3): qty 1

## 2014-11-30 MED ORDER — HYDROXYCHLOROQUINE SULFATE 200 MG PO TABS
200.0000 mg | ORAL_TABLET | Freq: Two times a day (BID) | ORAL | Status: DC
  Administered 2014-11-30 – 2014-12-02 (×4): 200 mg via ORAL
  Filled 2014-11-30 (×4): qty 1

## 2014-11-30 MED ORDER — SENNOSIDES-DOCUSATE SODIUM 8.6-50 MG PO TABS: 1.0000 | ORAL_TABLET | Freq: Every evening | ORAL | Status: DC | PRN

## 2014-11-30 MED ORDER — ASPIRIN 300 MG RE SUPP: 300.0000 mg | Freq: Every day | RECTAL | Status: DC

## 2014-11-30 NOTE — ED Notes (Signed)
Admitting MD at bedside.

## 2014-11-30 NOTE — ED Notes (Signed)
WENT COLLECT BLOOD PT,WITH X-RAY

## 2014-11-30 NOTE — Consult Note (Signed)
Referring Physician: ED    Chief Complaint: left arm numbness-weakness, slurred speech  HPI:                                                                                                                                         Thomas Willis is an 49 y.o. male with a past medical history significant for HTN, smoking, Lupus, pancreatitis, chronic low back pain due to L4, L5 disc disease, who was in his usual state of health until 11 am today when he developed sudden onset of chest tightness followed by left arm numbness. Stated that his left arm went completely numb " and my hand locked up". No weakness of the left leg, vertigo, double vision, confusion, or vision disturbances. Stated that his girlfriend noticed that he was slurring his words and walking with a limp in the left leg. He initially presented to [facility] where he had NIHSS 3 with rapidly improving symptoms.  CT brain was personally reviewed and showed no acute abnormality. Presently, he tells me that his speech sounds normal to him and the only issue is that left arm is still numb.  Date last known well: 11/30/14 Time last known well: 11 am tPA Given: no, minimal symptoms  NIHSS: 1 (only sensory) MRS: 0  Past Medical History  Diagnosis Date  . Hypertension   . Back pain, chronic   . Blood disorder     LPC, treated at [facility]  . Arthritis   . Lupus   . Pancreatitis   . Lumbar herniated disc     L4, L5    Past Surgical History  Procedure Laterality Date  . Hernia repair    . Colonoscopy with propofol N/A 09/12/2014    Procedure: COLONOSCOPY WITH PROPOFOL;  Surgeon: Hulen Luster, MD;  Location: [facility] ENDOSCOPY;  Service: Gastroenterology;  Laterality: N/A;  . Esophagogastroduodenoscopy N/A 09/12/2014    Procedure: ESOPHAGOGASTRODUODENOSCOPY (EGD);  Surgeon: Hulen Luster, MD;  Location: [facility] ENDOSCOPY;  Service: Gastroenterology;  Laterality: N/A;    No family history on file. Social History:  reports that he has been smoking  Cigarettes.  He has never used smokeless tobacco. He reports that he uses illicit drugs (Marijuana). He reports that he does not drink alcohol. Family history: no MS, brain tumor, epilepsy, or brain tumor Allergies:  Allergies  Allergen Reactions  . Ketoconazole Rash    Medications:  I have reviewed the patient's current medications.  ROS:                                                                                                                                       History obtained from chart review and the patient  General ROS: negative for - chills, fatigue, fever, night sweats, weight gain or weight loss Psychological ROS: negative for - behavioral disorder, hallucinations, memory difficulties, mood swings or suicidal ideation Ophthalmic ROS: negative for - blurry vision, double vision, eye pain or loss of vision ENT ROS: negative for - epistaxis, nasal discharge, oral lesions, sore throat, tinnitus or vertigo Allergy and Immunology ROS: negative for - hives or itchy/watery eyes Hematological and Lymphatic ROS: negative for - bleeding problems, bruising or swollen lymph nodes Endocrine ROS: negative for - galactorrhea, hair pattern changes, polydipsia/polyuria or temperature intolerance Respiratory ROS: negative for - cough, hemoptysis, shortness of breath or wheezing Cardiovascular ROS: negative for - chest pain, dyspnea on exertion, edema or irregular heartbeat Gastrointestinal ROS: negative for - abdominal pain, diarrhea, hematemesis, nausea/vomiting or stool incontinence Genito-Urinary ROS: negative for - dysuria, hematuria, incontinence or urinary frequency/urgency Musculoskeletal ROS: negative for - joint swelling or muscular weakness Neurological ROS: as noted in HPI Dermatological ROS: negative for rash and skin lesion changes   Physical exam:   Constitutional: well developed, pleasant male in no apparent distress. Blood pressure 140/89, pulse 50, temperature 98.1 F (36.7 C), temperature source Oral, resp. rate 11, height 5\' 10"  (1.778 m), weight 83.915 kg (185 lb), SpO2 100 %. Eyes: no jaundice or exophthalmos.  Head: normocephalic. Neck: supple, no bruits, no JVD. Cardiac: no murmurs. Lungs: clear. Abdomen: soft, no tender, no mass. Extremities: no edema, clubbing, or cyanosis.  Skin: no rash   Neurologic Examination:                                                                                                      General: Mental Status: Alert, oriented, thought content appropriate.  Speech fluent without evidence of aphasia.  Able to follow 3 step commands without difficulty. Cranial Nerves: II: Discs flat bilaterally; Visual fields grossly normal, pupils equal, round, reactive to light and accommodation III,IV, VI: ptosis not present, extra-ocular motions intact bilaterally V,VII: smile symmetric, facial light touch sensation normal bilaterally VIII: hearing normal bilaterally IX,X: uvula rises symmetrically XI: bilateral shoulder shrug XII: midline tongue extension without atrophy or fasciculations  Motor: Right : Upper extremity   5/5  Left:     Upper extremity   5/5  Lower extremity   5/5     Lower extremity   5/5 Tone and bulk:normal tone throughout; no atrophy noted Sensory: Pinprick and light touch slightly diminished left arm-leg Deep Tendon Reflexes:  1+ all over Plantars: Right: downgoing   Left: downgoing Cerebellar: normal finger-to-nose,  normal heel-to-shin test Gait:  No tested due to multiple leads    Results for orders placed or performed during the hospital encounter of 11/30/14 (from the past 48 hour(s))  Protime-INR     Status: None   Collection Time: 11/30/14  1:36 PM  Result Value Ref Range   Prothrombin Time 13.2 11.6 - 15.2 seconds   INR 0.98 0.00 - 1.49  APTT     Status: None    Collection Time: 11/30/14  1:36 PM  Result Value Ref Range   aPTT 37 24 - 37 seconds    Comment:        IF BASELINE aPTT IS ELEVATED, SUGGEST PATIENT RISK ASSESSMENT BE USED TO DETERMINE APPROPRIATE ANTICOAGULANT THERAPY.   I-stat troponin, ED     Status: None   Collection Time: 11/30/14  1:46 PM  Result Value Ref Range   Troponin i, poc 0.00 0.00 - 0.08 ng/mL   Comment 3            Comment: Due to the release kinetics of cTnI, a negative result within the first hours of the onset of symptoms does not rule out myocardial infarction with certainty. If myocardial infarction is still suspected, repeat the test at appropriate intervals.   Basic metabolic panel     Status: None   Collection Time: 11/30/14  2:00 PM  Result Value Ref Range   Sodium 139 135 - 145 mmol/L   Potassium 4.3 3.5 - 5.1 mmol/L   Chloride 104 101 - 111 mmol/L   CO2 30 22 - 32 mmol/L   Glucose, Bld 87 65 - 99 mg/dL   BUN 7 6 - 20 mg/dL   Creatinine, Ser 1.10 0.61 - 1.24 mg/dL   Calcium 9.6 8.9 - 10.3 mg/dL   GFR calc non Af Amer >60 >60 mL/min   GFR calc Af Amer >60 >60 mL/min    Comment: (NOTE) The eGFR has been calculated using the CKD EPI equation. This calculation has not been validated in all clinical situations. eGFR's persistently <60 mL/min signify possible Chronic Kidney Disease.    Anion gap 5 5 - 15  CBC     Status: None   Collection Time: 11/30/14  2:00 PM  Result Value Ref Range   WBC 4.4 4.0 - 10.5 K/uL   RBC 4.68 4.22 - 5.81 MIL/uL   Hemoglobin 14.8 13.0 - 17.0 g/dL   HCT 42.3 39.0 - 52.0 %   MCV 90.4 78.0 - 100.0 fL   MCH 31.6 26.0 - 34.0 pg   MCHC 35.0 30.0 - 36.0 g/dL   RDW 12.8 11.5 - 15.5 %   Platelets 322 150 - 400 K/uL  Differential     Status: None (Preliminary result)   Collection Time: 11/30/14  2:00 PM  Result Value Ref Range   Neutrophils Relative % PENDING 43 - 77 %   Neutro Abs PENDING 1.7 - 7.7 K/uL   Band Neutrophils PENDING 0 - 10 %   Lymphocytes Relative  PENDING 12 - 46 %   Lymphs Abs PENDING 0.7 - 4.0 K/uL   Monocytes Relative PENDING 3 - 12 %  Monocytes Absolute PENDING 0.1 - 1.0 K/uL   Eosinophils Relative PENDING 0 - 5 %   Eosinophils Absolute PENDING 0.0 - 0.7 K/uL   Basophils Relative PENDING 0 - 1 %   Basophils Absolute PENDING 0.0 - 0.1 K/uL   WBC Morphology PENDING    RBC Morphology PENDING    Smear Review PENDING    nRBC PENDING 0 /100 WBC   Metamyelocytes Relative PENDING %   Myelocytes PENDING %   Promyelocytes Absolute PENDING %   Blasts PENDING %  Ethanol     Status: None   Collection Time: 11/30/14  2:14 PM  Result Value Ref Range   Alcohol, Ethyl (B) <5 <5 mg/dL    Comment:        LOWEST DETECTABLE LIMIT FOR SERUM ALCOHOL IS 5 mg/dL FOR MEDICAL PURPOSES ONLY   CBG monitoring, ED     Status: None   Collection Time: 11/30/14  2:25 PM  Result Value Ref Range   Glucose-Capillary 86 65 - 99 mg/dL  Urine rapid drug screen (hosp performed)not at [facility]     Status: Abnormal   Collection Time: 11/30/14  3:44 PM  Result Value Ref Range   Opiates NONE DETECTED NONE DETECTED   Cocaine NONE DETECTED NONE DETECTED   Benzodiazepines NONE DETECTED NONE DETECTED   Amphetamines NONE DETECTED NONE DETECTED   Tetrahydrocannabinol POSITIVE (A) NONE DETECTED   Barbiturates NONE DETECTED NONE DETECTED    Comment:        DRUG SCREEN FOR MEDICAL PURPOSES ONLY.  IF CONFIRMATION IS NEEDED FOR ANY PURPOSE, NOTIFY LAB WITHIN 5 DAYS.        LOWEST DETECTABLE LIMITS FOR URINE DRUG SCREEN Drug Class       Cutoff (ng/mL) Amphetamine      1000 Barbiturate      200 Benzodiazepine   329 Tricyclics       924 Opiates          300 Cocaine          300 THC              50   Urinalysis, Routine w reflex microscopic (not at [facility])     Status: None   Collection Time: 11/30/14  3:45 PM  Result Value Ref Range   Color, Urine YELLOW YELLOW   APPearance CLEAR CLEAR   Specific Gravity, Urine 1.006 1.005 - 1.030   pH 6.5 5.0 - 8.0    Glucose, UA NEGATIVE NEGATIVE mg/dL   Hgb urine dipstick NEGATIVE NEGATIVE   Bilirubin Urine NEGATIVE NEGATIVE   Ketones, ur NEGATIVE NEGATIVE mg/dL   Protein, ur NEGATIVE NEGATIVE mg/dL   Urobilinogen, UA 0.2 0.0 - 1.0 mg/dL   Nitrite NEGATIVE NEGATIVE   Leukocytes, UA NEGATIVE NEGATIVE    Comment: MICROSCOPIC NOT DONE ON URINES WITH NEGATIVE PROTEIN, BLOOD, LEUKOCYTES, NITRITE, OR GLUCOSE <1000 mg/dL.   Dg Chest 2 View  11/30/2014   CLINICAL DATA:  Chest pain and LEFT arm numbness.  Acute onset  EXAM: CHEST  2 VIEW  COMPARISON:  02/02/2014  FINDINGS: Normal mediastinum and cardiac silhouette. Normal pulmonary vasculature. No evidence of effusion, infiltrate, or pneumothorax. No acute bony abnormality.  IMPRESSION: No acute cardiopulmonary process.   Electronically Signed   By: Suzy Bouchard M.D.   On: 11/30/2014 13:56   Ct Head Wo Contrast  11/30/2014   CLINICAL DATA:  Left arm numbness today.  EXAM: CT HEAD WITHOUT CONTRAST  TECHNIQUE: Contiguous axial images were obtained from the base  of the skull through the vertex without intravenous contrast.  COMPARISON:  None.  FINDINGS: Normal appearing cerebral hemispheres and posterior fossa structures. Normal size and position of the ventricles. No intracranial hemorrhage, mass lesion or CT evidence of acute infarction. Unremarkable bones and included paranasal sinuses.  IMPRESSION: Normal examination.   Electronically Signed   By: Claudie Revering M.D.   On: 11/30/2014 14:52    Assessment: 49 y.o. male with new onset left arm numbness and slurred speech. NIHSS1 for sensory. CT brain without acute abnormality. Patient has risk factors for stroke and thus can not exclude an acute right thalamic lacune. Agree with admission to medicine and complete stroke work up. Aspirin (already passed swallowing evaluation).  Stroke Risk Factors - HTN, smoking  Plan: 1. HgbA1c, fasting lipid panel 2. MRI, MRA  of the brain without contrast 3. Echocardiogram 4.  Carotid dopplers 5. Prophylactic therapy-aspirin 81 mg daily 6. Risk factor modification 7. Telemetry monitoring 8. Frequent neuro checks 9. PT/OT SLP (no needed at this time)  Lorenza Chick [facility] [phone]  11/30/2014, 5:33 PM

## 2014-11-30 NOTE — H&P (Addendum)
[facility] History and Physical  [name] KXF:[id number] DOB: [birth year] DOA: 11/30/2014  Referring physician: Doy Mince PCP: PROVIDER NOT IN SYSTEM Dr. Nicki Reaper in [place]  Chief Complaint: left sided weakness  HPI: Thomas Willis is a 49 y.o. male  With h/o HTN, tobacco abuse, MCTD presents to [facility] ED with left sided weakness and numbness. Friend also reported difficulty speaking. Code stroke called and patient transferred to [facility] ED. CT brain without any acute findings. TPA not given because symptoms resolving.  Weakness and speech now back to normal, but some numbness remains in left arm and leg. Also had left chest discomfort.  Seen by neuro in ED   Review of Systems:  Complete symptoms reviewed. As above, otherwise negative.  Past Medical History  Diagnosis Date  . Hypertension   . Back pain, chronic   . Blood disorder     LPC, treated at [facility]  . Arthritis   . Lupus   . Pancreatitis   . Lumbar herniated disc     L4, L5   Past Surgical History  Procedure Laterality Date  . Hernia repair    . Colonoscopy with propofol N/A 09/12/2014    Procedure: COLONOSCOPY WITH PROPOFOL;  Surgeon: Hulen Luster, MD;  Location: [facility] ENDOSCOPY;  Service: Gastroenterology;  Laterality: N/A;  . Esophagogastroduodenoscopy N/A 09/12/2014    Procedure: ESOPHAGOGASTRODUODENOSCOPY (EGD);  Surgeon: Hulen Luster, MD;  Location: [facility] ENDOSCOPY;  Service: Gastroenterology;  Laterality: N/A;   Social History:  reports that he has been smoking Cigarettes.  He has never used smokeless tobacco. He reports that he uses illicit drugs (Marijuana). He reports that he does not drink alcohol.unemployed  Allergies  Allergen Reactions  . Ketoconazole Rash    Family History  Problem Relation Age of Onset  . Hypertension    . CAD      Prior to Admission medications   Medication Sig Start Date End Date Taking? Authorizing Provider  hydrochlorothiazide (HYDRODIURIL) 25 MG tablet Take 1 tablet (25 mg  total) by mouth daily. 06/19/13  Yes John Molpus, MD  hydroxychloroquine (PLAQUENIL) 200 MG tablet Take 200 mg by mouth 2 (two) times daily. 09/24/14 09/24/15 Yes Historical Provider, MD  leucovorin (WELLCOVORIN) 5 MG tablet Take 5 mg by mouth every 12 (twelve) hours. For 3 days after chemo   Yes Historical Provider, MD  lisinopril (PRINIVIL,ZESTRIL) 10 MG tablet Take 10 mg by mouth daily.   Yes Historical Provider, MD  TREXALL 10 MG tablet Take 20 mg by mouth once a week. Monday 11/12/14  Yes Historical Provider, MD   Physical Exam: Filed Vitals:   11/30/14 1630 11/30/14 1645 11/30/14 1700 11/30/14 1718  BP: 139/89  140/89 140/89  Pulse:    50  Temp:      TempSrc:      Resp: 19 20 16 11   Height:      Weight:      SpO2:    100%    Wt Readings from Last 3 Encounters:  11/30/14 83.915 kg (185 lb)  09/12/14 83.915 kg (185 lb)  06/18/13 81.647 kg (180 lb)    BP 142/77 mmHg  Pulse 51  Temp(Src) 98.4 F (36.9 C) (Oral)  Resp 20  Ht 5\' 10"  (1.778 m)  Wt 83.915 kg (185 lb)  BMI 26.54 kg/m2  SpO2 100%  General Appearance:    Alert, cooperative, no distress, appears stated age  Head:    Normocephalic, without obvious abnormality, atraumatic  Eyes:    PERRL, conjunctiva/corneas  clear, EOM's intact      Nose:   Nares normal, septum midline, mucosa normal, no drainage   or sinus tenderness  Throat:   Lips, mucosa, and tongue normal; teeth and gums normal  Neck:   Supple, symmetrical, trachea midline, no adenopathy;       thyroid:  No enlargement/tenderness/nodules; no carotid   bruit or JVD  Back:     Symmetric, no curvature, ROM normal, no CVA tenderness  Lungs:     Clear to auscultation bilaterally, respirations unlabored  Chest wall:    No tenderness or deformity  Heart:    Regular rate and rhythm, S1 and S2 normal, no murmur, rub   or gallop  Abdomen:     Soft, non-tender, bowel sounds active all four quadrants,    no masses, no organomegaly  Genitalia:    deferred  Rectal:     deferred  Extremities:   Extremities normal, atraumatic, no cyanosis or edema  Pulses:   2+ and symmetric all extremities  Skin:   Skin color, texture, turgor normal, no rashes or lesions  Lymph nodes:   Cervical, supraclavicular, and axillary nodes normal  Neurologic:   CNII-XII intact. Speech clear and fluent strength 5/5 throughout no drift. Decreased sensation left UE and LE             Psych: normal affect Labs on Admission:  Basic Metabolic Panel:  Recent Labs Lab 11/30/14 1400  NA 139  K 4.3  CL 104  CO2 30  GLUCOSE 87  BUN 7  CREATININE 1.10  CALCIUM 9.6   Liver Function Tests: No results for input(s): AST, ALT, ALKPHOS, BILITOT, PROT, ALBUMIN in the last 168 hours. No results for input(s): LIPASE, AMYLASE in the last 168 hours. No results for input(s): AMMONIA in the last 168 hours. CBC:  Recent Labs Lab 11/30/14 1400  WBC 4.4  NEUTROABS PENDING  HGB 14.8  HCT 42.3  MCV 90.4  PLT 322   Cardiac Enzymes: No results for input(s): CKTOTAL, CKMB, CKMBINDEX, TROPONINI in the last 168 hours.  BNP (last 3 results) No results for input(s): BNP in the last 8760 hours.  ProBNP (last 3 results) No results for input(s): PROBNP in the last 8760 hours.  CBG:  Recent Labs Lab 11/30/14 1425  GLUCAP 86    Radiological Exams on Admission: Dg Chest 2 View  11/30/2014   CLINICAL DATA:  Chest pain and LEFT arm numbness.  Acute onset  EXAM: CHEST  2 VIEW  COMPARISON:  02/02/2014  FINDINGS: Normal mediastinum and cardiac silhouette. Normal pulmonary vasculature. No evidence of effusion, infiltrate, or pneumothorax. No acute bony abnormality.  IMPRESSION: No acute cardiopulmonary process.   Electronically Signed   By: Suzy Bouchard M.D.   On: 11/30/2014 13:56   Ct Head Wo Contrast  11/30/2014   CLINICAL DATA:  Left arm numbness today.  EXAM: CT HEAD WITHOUT CONTRAST  TECHNIQUE: Contiguous axial images were obtained from the base of the skull through the vertex without  intravenous contrast.  COMPARISON:  None.  FINDINGS: Normal appearing cerebral hemispheres and posterior fossa structures. Normal size and position of the ventricles. No intracranial hemorrhage, mass lesion or CT evidence of acute infarction. Unremarkable bones and included paranasal sinuses.  IMPRESSION: Normal examination.   Electronically Signed   By: Claudie Revering M.D.   On: 11/30/2014 14:52    EKG: tracing reviewed. Sinus bradycardia Probable anteroseptal infarct, old Borderline T abnormalities, inferior leads  Assessment/Plan Principal Problem:   Left-sided  weakness: improving. Asa. Permissive HTN. Echo carotids, MRI, MRA, hgb a1c. Lipid profile . Needs to quit smoking Active Problems:   Essential hypertension, benign: hold meds   Tobacco abuse patch prn MCTD: cont plaquenil  Code Status: full Family Communication: friend at bedside Disposition Plan: obs. Home after workup complete  Time spent: 60 min  [facility]

## 2014-11-30 NOTE — ED Notes (Signed)
Neurologist at bedside. 

## 2014-11-30 NOTE — ED Provider Notes (Signed)
Patient sent from [facility] long for further neurologic workup. Reexamined on arrival and he is well appearing, nontoxic, normal respiratory effort, normal perfusion, 4 out of 5 strength in left upper and left lower extremity, 5 out of 5 strength in right upper and right lower extremity, cranial nerves intact.  I discussed his case with Dr. Aram Beecham, who recommended admission and stroke workup.  Clinical Impression: 1. Left-sided weakness       Serita Grit, MD 12/02/14 [phone]

## 2014-11-30 NOTE — ED Provider Notes (Signed)
CSN: [id number]     Arrival date & time 11/30/14  1308 History   First MD Initiated Contact with Patient 11/30/14 1357     Chief Complaint  Patient presents with  . chest spasms      (Consider location/radiation/quality/duration/timing/severity/associated sxs/prior Treatment) HPI Comments:  Patient states acute onset of numbness and weakness in his left arm around 11 AM. It was associated with some tightness in his chest. On further questioning his family says he is having slurred speech as well as difficulty walking left leg weakness. He denies any numbness in the leg. His daughter states he was walking unsteadily. He complains of numbness and tingling involving the left neck and left arm. Denies any history of cervical radiculopathy. He does have a history of sciatica. He has a history of lupus and connective tissue disorder and is on Plaquenil. He denies any headache , difficulty breathing, difficulty swallowing. His daughter feels his speech is still slurred. Last seen normal was 11 AM.  The history is provided by the patient and a relative.    Past Medical History  Diagnosis Date  . Hypertension   . Back pain, chronic   . Blood disorder     LPC, treated at [facility]  . Arthritis   . Lupus   . Pancreatitis    Past Surgical History  Procedure Laterality Date  . Hernia repair    . Colonoscopy with propofol N/A 09/12/2014    Procedure: COLONOSCOPY WITH PROPOFOL;  Surgeon: Hulen Luster, MD;  Location: [facility] ENDOSCOPY;  Service: Gastroenterology;  Laterality: N/A;  . Esophagogastroduodenoscopy N/A 09/12/2014    Procedure: ESOPHAGOGASTRODUODENOSCOPY (EGD);  Surgeon: Hulen Luster, MD;  Location: [facility] ENDOSCOPY;  Service: Gastroenterology;  Laterality: N/A;   No family history on file. Social History  Substance Use Topics  . Smoking status: Light Tobacco Smoker    Types: Cigarettes  . Smokeless tobacco: Never Used  . Alcohol Use: No    Review of Systems  Constitutional: Negative for fever,  activity change and appetite change.  HENT: Negative for congestion and rhinorrhea.   Respiratory: Positive for chest tightness. Negative for cough and shortness of breath.   Cardiovascular: Positive for chest pain.  Gastrointestinal: Negative for nausea, vomiting and abdominal pain.  Genitourinary: Negative for dysuria, hematuria and testicular pain.  Musculoskeletal: Negative for myalgias and arthralgias.  Skin: Negative for rash.  Neurological: Positive for speech difficulty, weakness and numbness. Negative for dizziness, seizures, syncope, facial asymmetry and headaches.  A complete 10 system review of systems was obtained and all systems are negative except as noted in the HPI and PMH.      Allergies  Ketoconazole  Home Medications   Prior to Admission medications   Medication Sig Start Date End Date Taking? Authorizing Provider  hydrochlorothiazide (HYDRODIURIL) 25 MG tablet Take 1 tablet (25 mg total) by mouth daily. 06/19/13  Yes John Molpus, MD  hydroxychloroquine (PLAQUENIL) 200 MG tablet Take 200 mg by mouth 2 (two) times daily. 09/24/14 09/24/15 Yes Historical Provider, MD  leucovorin (WELLCOVORIN) 5 MG tablet Take 5 mg by mouth every 12 (twelve) hours. For 3 days after chemo   Yes Historical Provider, MD  lisinopril (PRINIVIL,ZESTRIL) 10 MG tablet Take 10 mg by mouth daily.   Yes Historical Provider, MD  TREXALL 10 MG tablet Take 20 mg by mouth once a week. Monday 11/12/14  Yes Historical Provider, MD  pantoprazole (PROTONIX) 20 MG tablet Take 1 tablet (20 mg total) by mouth daily. Patient not taking:  Reported on 11/30/2014 02/02/14   Hollace Kinnier Sofia, PA-C   BP 135/75 mmHg  Pulse 46  Temp(Src) 97.5 F (36.4 C)  Resp 18  SpO2 100% Physical Exam  Constitutional: He is oriented to person, place, and time. He appears well-developed and well-nourished. No distress.  HENT:  Head: Normocephalic and atraumatic.  Mouth/Throat: Oropharynx is clear and moist. No oropharyngeal  exudate.  Eyes: Conjunctivae and EOM are normal. Pupils are equal, round, and reactive to light.  Neck: Normal range of motion. Neck supple.  No meningismus.  Cardiovascular: Normal rate, regular rhythm, normal heart sounds and intact distal pulses.   No murmur heard. Pulmonary/Chest: Effort normal and breath sounds normal. No respiratory distress.  Abdominal: Soft. There is no tenderness. There is no rebound and no guarding.  Musculoskeletal: Normal range of motion. He exhibits no edema or tenderness.  Neurological: He is alert and oriented to person, place, and time. No cranial nerve deficit. He exhibits normal muscle tone. Coordination normal.  CN 2-12 intact.  4/5 strength L arm with grip strength weakness. Decreased sensation to L arm and L leg.  4/5 strength L leg.  No ataxia on finger to nose, no pronator drift.  No appreciable slurred speech.  Skin: Skin is warm.  Psychiatric: He has a normal mood and affect. His behavior is normal.  Nursing note and vitals reviewed.   ED Course  Procedures (including critical care time) Labs Review Labs Reviewed  BASIC METABOLIC PANEL  CBC  ETHANOL  PROTIME-INR  APTT  DIFFERENTIAL  URINE RAPID DRUG SCREEN, HOSP PERFORMED  URINALYSIS, ROUTINE W REFLEX MICROSCOPIC (NOT AT [facility])  I-STAT TROPOININ, ED  CBG MONITORING, ED    Imaging Review Dg Chest 2 View  11/30/2014   CLINICAL DATA:  Chest pain and LEFT arm numbness.  Acute onset  EXAM: CHEST  2 VIEW  COMPARISON:  02/02/2014  FINDINGS: Normal mediastinum and cardiac silhouette. Normal pulmonary vasculature. No evidence of effusion, infiltrate, or pneumothorax. No acute bony abnormality.  IMPRESSION: No acute cardiopulmonary process.   Electronically Signed   By: Suzy Bouchard M.D.   On: 11/30/2014 13:56   Ct Head Wo Contrast  11/30/2014   CLINICAL DATA:  Left arm numbness today.  EXAM: CT HEAD WITHOUT CONTRAST  TECHNIQUE: Contiguous axial images were obtained from the base of the skull  through the vertex without intravenous contrast.  COMPARISON:  None.  FINDINGS: Normal appearing cerebral hemispheres and posterior fossa structures. Normal size and position of the ventricles. No intracranial hemorrhage, mass lesion or CT evidence of acute infarction. Unremarkable bones and included paranasal sinuses.  IMPRESSION: Normal examination.   Electronically Signed   By: Claudie Revering M.D.   On: 11/30/2014 14:52   I have personally reviewed and evaluated these images and lab results as part of my medical decision-making.   EKG Interpretation   Date/Time:  Saturday November 30 2014 13:14:22 EDT Ventricular Rate:  46 PR Interval:  196 QRS Duration: 107 QT Interval:  424 QTC Calculation: 371 R Axis:   -7 Text Interpretation:  Sinus bradycardia Probable anteroseptal infarct, old  Borderline T abnormalities, inferior leads Confirmed by [place] [phone]) on 11/30/2014 1:59:18 PM      MDM   Final diagnoses:  Left-sided weakness    Patient seen as soon as he was brought from waiting room. He complains of left arm weakness and numbness. On Review of systems he also has weakness in his left leg with difficulty walking and  speech disturbance. Last seen normal at 11 AM.   Code stroke was activated. EKG without acute ischemic change.   case discussed with Dr. Aram Beecham of neurology.  Patient's NIH scale is about 3. His speech has improved according to the daughter. Left arm weakness and numbness persists. He does have left leg weakness on exam.  With a low NIH, improving symptoms, and 3.5 hour time frame Camilo does not recommend TPA.   CT head negative.  Patient's transfer to [facility] for neurology evaluation. Dr. Armida Sans not recommending tPA at this time.  Ezequiel Essex, MD 11/30/14 [phone]

## 2014-11-30 NOTE — ED Notes (Signed)
Pt placed into gown and on monitor upon arrival to room. Pt remains monitored by blood pressure, pulse ox, and 5 lead. pts family remains at bedside.

## 2014-11-30 NOTE — ED Notes (Signed)
MD aware of HR in upper 40's and does not feel any further action needed at this time.

## 2014-11-30 NOTE — ED Notes (Signed)
Pt remains monitored by blood pressure, pulse ox, and 5 lead.  

## 2014-11-30 NOTE — ED Notes (Signed)
[facility] ED charge RN made aware of pt being transferred to their facility.

## 2014-11-30 NOTE — ED Notes (Signed)
Patient transported to CT 

## 2014-11-30 NOTE — ED Notes (Addendum)
Pt here from [facility] via [facility] for further neurological workup.  Pt began experiencing L chest tightness and L arm numbness at 11 am while walking in store.  NIH at [facility] was 2.  CT neg per [facility].  Pt continues to feel L arm numbness, but no longer experiencing L chest tightness.  Pt just started taking Trexall 1 week ago.

## 2014-11-30 NOTE — ED Notes (Signed)
Per pt, states left arm numbness and chest spasms which started a few minutes ago-states he thinks it is from a new med he just started for Lupus

## 2014-12-01 ENCOUNTER — Observation Stay (HOSPITAL_COMMUNITY): Payer: Medicaid Other

## 2014-12-01 DIAGNOSIS — I6789 Other cerebrovascular disease: Secondary | ICD-10-CM

## 2014-12-01 DIAGNOSIS — G459 Transient cerebral ischemic attack, unspecified: Secondary | ICD-10-CM

## 2014-12-01 DIAGNOSIS — I10 Essential (primary) hypertension: Secondary | ICD-10-CM | POA: Diagnosis not present

## 2014-12-01 DIAGNOSIS — I639 Cerebral infarction, unspecified: Secondary | ICD-10-CM | POA: Diagnosis not present

## 2014-12-01 DIAGNOSIS — M6289 Other specified disorders of muscle: Secondary | ICD-10-CM

## 2014-12-01 DIAGNOSIS — Z720 Tobacco use: Secondary | ICD-10-CM

## 2014-12-01 DIAGNOSIS — M358 Other specified systemic involvement of connective tissue: Secondary | ICD-10-CM | POA: Diagnosis not present

## 2014-12-01 LAB — LIPID PANEL
CHOLESTEROL: 161 mg/dL (ref 0–200)
HDL: 40 mg/dL — ABNORMAL LOW (ref >40)
LDL CALC: 94 mg/dL (ref 0–99)
TRIGLYCERIDES: 137 mg/dL (ref <150)
Total CHOL/HDL Ratio: 4 RATIO
VLDL: 27 mg/dL (ref 0–40)

## 2014-12-01 LAB — TROPONIN I: Troponin I: <0.03 ng/mL (ref <0.031)

## 2014-12-01 MED ORDER — ACETAMINOPHEN 325 MG PO TABS
650.0000 mg | ORAL_TABLET | Freq: Four times a day (QID) | ORAL | Status: DC | PRN
  Administered 2014-12-01 (×2): 650 mg via ORAL
  Filled 2014-12-01 (×2): qty 2

## 2014-12-01 MED ORDER — METHOTREXATE 2.5 MG PO TABS
20.0000 mg | ORAL_TABLET | ORAL | Status: DC
  Administered 2014-12-02: 20 mg via ORAL
  Filled 2014-12-01: qty 8

## 2014-12-01 MED ORDER — ATORVASTATIN CALCIUM 10 MG PO TABS
20.0000 mg | ORAL_TABLET | Freq: Every day | ORAL | Status: DC
  Administered 2014-12-01: 20 mg via ORAL
  Filled 2014-12-01: qty 2

## 2014-12-01 NOTE — Progress Notes (Signed)
OT Cancellation Note  Patient Details Name: Thomas Willis MRN: [id number] DOB: 06-01-1965   Cancelled Treatment:    Reason Eval/Treat Not Completed: OT screened, no needs identified, will sign off  - MRI negative for acute infarct, and PT reports pt is back to baseline.  OT will sign off.   Darlina Rumpf [place], OTR/L [phone]  12/01/2014, 12:06 PM

## 2014-12-01 NOTE — Progress Notes (Signed)
[facility] PROGRESS NOTE   Thomas Willis AYT:[id number] DOB: 30-Aug-1965 DOA: 11/30/2014 PCP: PROVIDER NOT IN SYSTEM  HPI/Subjective: Spoke with the patient in the presence of significant other at bedside. Patient symptoms resolved.  Assessment/Plan: Principal Problem:   Left-sided weakness Active Problems:   Essential hypertension, benign   Tobacco abuse   Mixed connective tissue disease    Left-sided weakness Left arm weakness with slurred speech which resolved by now, cannot rule out TIA. Neurology consultant recommended TIA workup MRI of the brain is negative so far, to the rest of the workup. 1. HgbA1c, fasting lipid panel 2. MRI, MRA of the brain without contrast 3. PT consult, OT consult, Speech consult 4. Echocardiogram 5. Carotid dopplers 6. Prophylactic therapy-Antiplatelet med: Aspirin  7. Risk factor modification 8. Telemetry monitoring.  Chest tightness This is likely none cardiac chest pain. Troponins negative 3 and -12-lead EKG for ischemic changes.  Essential hypertension Benign essential hypertension, continue to hold her medication allow permissive hypertension.  Tobacco abuse disorder Patient has tobacco abuse placed on nicotine patch.  MCTD On Plaquenil continued.   Code Status: Full Code Family Communication: Plan discussed with the patient. Disposition Plan: Remains inpatient Diet: Diet Heart Room service appropriate?: Yes; Fluid consistency:: Thin  Consultants:  Neuro  Procedures:  None  Antibiotics:  None   Objective: Filed Vitals:   12/01/14 0913  BP: 147/101  Pulse: 53  Temp: 98.2 F (36.8 C)  Resp: 16    Intake/Output Summary (Last 24 hours) at 12/01/14 1036 Last data filed at 12/01/14 0800  Gross per 24 hour  Intake    240 ml  Output    950 ml  Net   -710 ml   Filed Weights   11/30/14 1602 11/30/14 1956  Weight: 83.915 kg (185 lb) 85 kg (187 lb 6.3 oz)    Exam: General: Alert and awake,  oriented x3, not in any acute distress. HEENT: anicteric sclera, pupils reactive to light and accommodation, EOMI CVS: S1-S2 clear, no murmur rubs or gallops Chest: clear to auscultation bilaterally, no wheezing, rales or rhonchi Abdomen: soft nontender, nondistended, normal bowel sounds, no organomegaly Extremities: no cyanosis, clubbing or edema noted bilaterally Neuro: Cranial nerves II-XII intact, no focal neurological deficits  Data Reviewed: Basic Metabolic Panel:  Recent Labs Lab 11/30/14 1400  NA 139  K 4.3  CL 104  CO2 30  GLUCOSE 87  BUN 7  CREATININE 1.10  CALCIUM 9.6   Liver Function Tests: No results for input(s): AST, ALT, ALKPHOS, BILITOT, PROT, ALBUMIN in the last 168 hours. No results for input(s): LIPASE, AMYLASE in the last 168 hours. No results for input(s): AMMONIA in the last 168 hours. CBC:  Recent Labs Lab 11/30/14 1400  WBC 4.4  NEUTROABS PENDING  HGB 14.8  HCT 42.3  MCV 90.4  PLT 322   Cardiac Enzymes:  Recent Labs Lab 12/01/14 0611  TROPONINI <0.03   BNP (last 3 results) No results for input(s): BNP in the last 8760 hours.  ProBNP (last 3 results) No results for input(s): PROBNP in the last 8760 hours.  CBG:  Recent Labs Lab 11/30/14 1425  GLUCAP 86    Micro No results found for this or any previous visit (from the past 240 hour(s)).   Studies: Dg Chest 2 View  11/30/2014   CLINICAL DATA:  Chest pain and LEFT arm numbness.  Acute onset  EXAM: CHEST  2 VIEW  COMPARISON:  02/02/2014  FINDINGS: Normal mediastinum and cardiac silhouette. Normal  pulmonary vasculature. No evidence of effusion, infiltrate, or pneumothorax. No acute bony abnormality.  IMPRESSION: No acute cardiopulmonary process.   Electronically Signed   By: Suzy Bouchard M.D.   On: 11/30/2014 13:56   Ct Head Wo Contrast  11/30/2014   CLINICAL DATA:  Left arm numbness today.  EXAM: CT HEAD WITHOUT CONTRAST  TECHNIQUE: Contiguous axial images were obtained from  the base of the skull through the vertex without intravenous contrast.  COMPARISON:  None.  FINDINGS: Normal appearing cerebral hemispheres and posterior fossa structures. Normal size and position of the ventricles. No intracranial hemorrhage, mass lesion or CT evidence of acute infarction. Unremarkable bones and included paranasal sinuses.  IMPRESSION: Normal examination.   Electronically Signed   By: Claudie Revering M.D.   On: 11/30/2014 14:52   Mr Brain Wo Contrast  11/30/2014   CLINICAL DATA:  LEFT-sided weakness and numbness, difficulty speaking, follow-up code stroke. History of hypertension, lupus.  EXAM: MRI HEAD WITHOUT CONTRAST  MRA HEAD WITHOUT CONTRAST  TECHNIQUE: Multiplanar, multiecho pulse sequences of the brain and surrounding structures were obtained without intravenous contrast. Angiographic images of the head were obtained using MRA technique without contrast.  COMPARISON:  CT head November 30, 2014 at 1436 hours.  FINDINGS: MRI HEAD FINDINGS  Mildly motion degraded examination.  The ventricles and sulci are normal for patient's age. No abnormal parenchymal signal, mass lesions, mass effect. No reduced diffusion to suggest acute ischemia. No susceptibility artifact to suggest hemorrhage.  No abnormal extra-axial fluid collections. No extra-axial masses though, contrast enhanced sequences would be more sensitive. Normal major intracranial vascular flow voids seen at the skull base. 5 mm pineal cyst.  Ocular globes and orbital contents are unremarkable though not tailored for evaluation. No abnormal sellar expansion. Visualized paranasal sinuses and mastoid air cells are well-aerated. No suspicious calvarial bone marrow signal. No abnormal sellar expansion. Craniocervical junction maintained.  MRA HEAD FINDINGS  Moderately motion degraded examination.  The internal carotid arteries appear patent, limited assessment of the circle of Willis due to motion. Anterior and middle cerebral arteries  demonstrate normal flow related enhancement.  Codominant appearing vertebral arteries with normal flow related enhancement of basilar artery, posterior cerebral arteries.  No acute large vessel occlusion or definite high-grade stenosis though motion limits evaluation. Limited assessment for aneurysm.  IMPRESSION: MRI HEAD: Negative noncontrast MRI brain, specifically no acute ischemia.  MRA HEAD: Moderately motion degraded examination without acute large vessel occlusion.   Electronically Signed   By: Elon Alas M.D.   On: 11/30/2014 23:51   Mr Jodene Nam Head/brain Wo Cm  11/30/2014   CLINICAL DATA:  LEFT-sided weakness and numbness, difficulty speaking, follow-up code stroke. History of hypertension, lupus.  EXAM: MRI HEAD WITHOUT CONTRAST  MRA HEAD WITHOUT CONTRAST  TECHNIQUE: Multiplanar, multiecho pulse sequences of the brain and surrounding structures were obtained without intravenous contrast. Angiographic images of the head were obtained using MRA technique without contrast.  COMPARISON:  CT head November 30, 2014 at 1436 hours.  FINDINGS: MRI HEAD FINDINGS  Mildly motion degraded examination.  The ventricles and sulci are normal for patient's age. No abnormal parenchymal signal, mass lesions, mass effect. No reduced diffusion to suggest acute ischemia. No susceptibility artifact to suggest hemorrhage.  No abnormal extra-axial fluid collections. No extra-axial masses though, contrast enhanced sequences would be more sensitive. Normal major intracranial vascular flow voids seen at the skull base. 5 mm pineal cyst.  Ocular globes and orbital contents are unremarkable though not tailored for evaluation.  No abnormal sellar expansion. Visualized paranasal sinuses and mastoid air cells are well-aerated. No suspicious calvarial bone marrow signal. No abnormal sellar expansion. Craniocervical junction maintained.  MRA HEAD FINDINGS  Moderately motion degraded examination.  The internal carotid arteries appear  patent, limited assessment of the circle of Willis due to motion. Anterior and middle cerebral arteries demonstrate normal flow related enhancement.  Codominant appearing vertebral arteries with normal flow related enhancement of basilar artery, posterior cerebral arteries.  No acute large vessel occlusion or definite high-grade stenosis though motion limits evaluation. Limited assessment for aneurysm.  IMPRESSION: MRI HEAD: Negative noncontrast MRI brain, specifically no acute ischemia.  MRA HEAD: Moderately motion degraded examination without acute large vessel occlusion.   Electronically Signed   By: Elon Alas M.D.   On: 11/30/2014 23:51    Scheduled Meds: . aspirin  300 mg Rectal Daily   Or  . aspirin  325 mg Oral Daily  . enoxaparin (LOVENOX) injection  40 mg Subcutaneous Q24H  . hydroxychloroquine  200 mg Oral BID  . nicotine  14 mg Transdermal Daily   Continuous Infusions:      Time spent: 35 minutes    [facility] A  [facility] Pager [phone] If 7PM-7AM, please contact night-coverage at [email or web address], password [facility] 12/01/2014, 10:36 AM  LOS: 1 day

## 2014-12-01 NOTE — Progress Notes (Signed)
STROKE TEAM PROGRESS NOTE  HPI Thomas Willis is an 49 y.o. male with a past medical history significant for HTN, smoking, Lupus, pancreatitis, chronic low back pain due to L4, L5 disc disease, who was in his usual state of health until 11 am today when he developed sudden onset of chest tightness followed by left arm numbness. Stated that his left arm went completely numb " and my hand locked up". No weakness of the left leg, vertigo, double vision, confusion, or vision disturbances.  Stated that his girlfriend noticed that he was slurring his words and walking with a limp in the left leg. He initially presented to [facility] where he had NIHSS 3 with rapidly improving symptoms. CT brain was personally reviewed and showed no acute abnormality.  Presently, he tells me he feels much better.  There is no residual speech issues.  Apparently he has had a history of some neuropathic like sensations in his right lower extremity which he has been told is sciatica.  In retrospect, he has had similar sensations down the left arm in the past.  However, the episode include other neurologic problems.  History suggests there may be an acute on chronic problem.  Patient also reported that he has significant, unexplained poor appetite and the marijuana use helps to stimulate his appetite.  He expressed serious concerns regarding inabilty to eat and reported not eating while in the hospital  Date last known well: 11/30/14 Time last known well: 11 am tPA Given: no, minimal symptoms  NIHSS: 1 (only sensory) MRS: 0   SUBJECTIVE (INTERVAL HISTORY) His family was not at the bedside.  Overall he feels his condition is improved.     OBJECTIVE Temp:  [97.5 F (36.4 C)-98.4 F (36.9 C)] 98.2 F (36.8 C) (09/04 0600) Pulse Rate:  [46-69] 49 (09/04 0735) Cardiac Rhythm:  [-] Sinus bradycardia (09/03 2010) Resp:  [11-22] 16 (09/04 0735) BP: (123-174)/(74-107) 141/87 mmHg (09/04 0735) SpO2:  [99 %-100 %] 100 % (09/04  0735) Weight:  [83.915 kg (185 lb)-85 kg (187 lb 6.3 oz)] 85 kg (187 lb 6.3 oz) (09/03 1956)  CBC:  Recent Labs Lab 11/30/14 1400  WBC 4.4  NEUTROABS PENDING  HGB 14.8  HCT 42.3  MCV 90.4  PLT 779    Basic Metabolic Panel:  Recent Labs Lab 11/30/14 1400  NA 139  K 4.3  CL 104  CO2 30  GLUCOSE 87  BUN 7  CREATININE 1.10  CALCIUM 9.6    Lipid Panel: No results found for: CHOL, TRIG, HDL, CHOLHDL, VLDL, LDLCALC HgbA1c: No results found for: HGBA1C Urine Drug Screen:    Component Value Date/Time   LABOPIA NONE DETECTED 11/30/2014 1544   COCAINSCRNUR NONE DETECTED 11/30/2014 1544   LABBENZ NONE DETECTED 11/30/2014 1544   AMPHETMU NONE DETECTED 11/30/2014 1544   THCU POSITIVE* 11/30/2014 1544   LABBARB NONE DETECTED 11/30/2014 1544      IMAGING  Dg Chest 2 View 11/30/2014    No acute cardiopulmonary process.  Ct Head Wo Contrast 11/30/2014     Normal examination.    Mr Brain Wo Contrast 11/30/2014    MRI HEAD:  Negative noncontrast MRI brain, specifically no acute ischemia.   MRA HEAD:  Moderately motion degraded examination without acute large vessel occlusion.       PHYSICAL EXAM Physical Exam General - Well nourished, well developed, in NAD   Cardiovascular - Regular rate and rhythm Pulmonary: CTA Abdomen: NT, ND, normal bowel sounds Extremities: No C/C/E  Neurological Exam Mental Status: Normal Orientation:  Oriented to person, place and time Speech:  Fluent; no dysarthria Cranial Nerves:  PERRL; EOMI; visual fields full, face grossly symmetric, hearing grossly intact; shrug symmetric and tongue midline Motor Exam:  Tone:  Within normal limits; Strength: 5/5 throughout Sensory: Intact to light touch throughout; except slight decrease in lateral right thhigh Coordination:  Intact finger to nose Gait: Deferred   ASSESSMENT/PLAN [name] is a 49 y.o. male with history of HTN, smoking, Lupus, pancreatitis, chronic low back pain  due to L4, L5 disc disease presenting with chest tightness, left arm numbness, slurred speech, gait disturbance left leg, and left hand spasm.Marland Kitchen He did not receive IV t-PA due to minimal deficits.  Possible TIA:  Non-dominant   Resultant  resolution of deficits  MRI  negative  MRA  negative  Carotid Doppler  pending  2D Echo  pending  LDL 94  HgbA1c pending  Lovenox for VTE prophylaxis  Diet Heart Room service appropriate?: Yes; Fluid consistency:: Thin  no antithrombotic prior to admission, now on aspirin 325 mg orally every day  Patient counseled to be compliant with his antithrombotic medications  Ongoing aggressive stroke risk factor management  Therapy recommendations: No follow-up PT or OT  Disposition: Pending  Hypertension  Hypertensive; goals for secondary prevention should be provided as instructions at discharge  Hyperlipidemia  Home meds: No lipid lowering medications prior to admission.  LDL 94, goal < 70  Add Lipitor 20 mg daily  Continue statin at discharge  Other Stroke Risk Factors  Cigarette smoker, advised to stop smoking  Marijuana use  Other Active Problems    Hospital day # 1  ATTENDING NOTE: Patient was seen and examined by me personally. Documentation accurately reflects findings. The laboratory and radiographic studies reviewed by me. ROS completed by me personally and pertinent positives fully documented  Assessment and plan completed by me personally and fully documented above. Plans include:  Follow-up on TIA work-up as described above.  Patient may need to undergo C-Spine evaluation given the chronic symptoms that sound radiculopathic. Condition is improved Patient medically stable, but hypertensive SIGNED BY: Dr. Elissa Hefty       To contact Stroke Continuity provider, please refer to [email or web address] After hours, contact General Neurology

## 2014-12-01 NOTE — Progress Notes (Signed)
  Echocardiogram 2D Echocardiogram has been performed.  Tresa Res 12/01/2014, 12:49 PM
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
## 2014-12-01 NOTE — Progress Notes (Signed)
Pt arrived to room 5M11 from ED.  Pt is alert and oriented.  Tele applied and safety measures in place.  Will continue to monitor.    Fredrich Romans, RN

## 2014-12-02 ENCOUNTER — Other Ambulatory Visit: Payer: Self-pay | Admitting: Neurology

## 2014-12-02 ENCOUNTER — Observation Stay (HOSPITAL_COMMUNITY): Payer: Medicaid Other

## 2014-12-02 DIAGNOSIS — M6289 Other specified disorders of muscle: Secondary | ICD-10-CM | POA: Diagnosis not present

## 2014-12-02 DIAGNOSIS — I639 Cerebral infarction, unspecified: Secondary | ICD-10-CM

## 2014-12-02 DIAGNOSIS — M358 Other specified systemic involvement of connective tissue: Secondary | ICD-10-CM | POA: Diagnosis not present

## 2014-12-02 DIAGNOSIS — I6340 Cerebral infarction due to embolism of unspecified cerebral artery: Secondary | ICD-10-CM

## 2014-12-02 DIAGNOSIS — G459 Transient cerebral ischemic attack, unspecified: Secondary | ICD-10-CM

## 2014-12-02 DIAGNOSIS — I10 Essential (primary) hypertension: Secondary | ICD-10-CM | POA: Diagnosis not present

## 2014-12-02 DIAGNOSIS — E785 Hyperlipidemia, unspecified: Secondary | ICD-10-CM

## 2014-12-02 LAB — ANTITHROMBIN III: AntiThromb III Func: 113 % (ref 75–120)

## 2014-12-02 MED ORDER — ASPIRIN 325 MG PO TABS: 325.0000 mg | ORAL_TABLET | Freq: Every day | ORAL | Status: DC

## 2014-12-02 MED ORDER — ATORVASTATIN CALCIUM 20 MG PO TABS
20.0000 mg | ORAL_TABLET | Freq: Every day | ORAL | Status: DC
Start: 2014-12-02 — End: 2015-11-07

## 2014-12-02 NOTE — Evaluation (Signed)
SLP Cancellation Note  Patient Details Name: BRADAN CONGROVE MRN: [id number] DOB: January 09, 1966   Cancelled treatment:       Reason Eval/Treat Not Completed: Other (comment);SLP screened, no needs identified, will sign off   Luanna Salk, [facility] SLP [phone]

## 2014-12-02 NOTE — Progress Notes (Signed)
VASCULAR LAB PRELIMINARY  PRELIMINARY  PRELIMINARY  PRELIMINARY  Carotid duplex completed.    Preliminary report:  1-39% plaquing.  Vertebral artery flow is antegrade.   Rosalee Tolley, RVT 12/02/2014, 9:09 AM

## 2014-12-02 NOTE — Progress Notes (Signed)
VASCULAR LAB PRELIMINARY  PRELIMINARY  PRELIMINARY  PRELIMINARY  Bilateral lower extremity venous duplex completed.    Preliminary report:  There is no DVT or SVT noted in the bilateral lower extremities.   Lysbeth Dicola, RVT 12/02/2014, 9:08 AM

## 2014-12-02 NOTE — Discharge Summary (Addendum)
Physician Discharge Summary  Thomas Willis QBH:[id number] DOB: [birth year] DOA: 11/30/2014  PCP: McIntire date: 11/30/2014 Discharge date: 12/02/2014  Time spent: 40 minutes  Recommendations for Outpatient Follow-up:  1.  Follow up with PCP in one week. 2. Follow-up with rheumatology as outpatient to rule out cervical radiculopathy.  Discharge Diagnoses:  Principal Problem:   Left-sided weakness Active Problems:   Essential hypertension, benign   Tobacco abuse   Mixed connective tissue disease   Discharge Condition: Stable  Diet recommendation: Heart healthy  Filed Weights   11/30/14 1602 11/30/14 1956  Weight: 83.915 kg (185 lb) 85 kg (187 lb 6.3 oz)    History of present illness:  Thomas Willis is a 49 y.o. male  With h/o HTN, tobacco abuse, MCTD presents to [facility] ED with left sided weakness and numbness. Friend also reported difficulty speaking. Code stroke called and patient transferred to [facility] ED. CT brain without any acute findings. TPA not given because symptoms resolving. Weakness and speech now back to normal, but some numbness remains in left arm and leg. Also had left chest discomfort. Seen by neuro in ED  Hospital Course:    Left-sided weakness Left arm weakness with slurred speech which resolved by now, cannot rule out TIA. Neurology consultant recommended TIA workup MRI of the brain is negative so far, to the rest of the workup. 1. Fasting lipid profile: Total cholesterol 161, LDL 94, Lipitor 20 mg added for goal of <70. 2. MRI, MRA of the brain without contrast: Showed no acute events. 3. PT consult, OT consult, Speech consult: All recommended no follow-up. 4. Echocardiogram: Showed no sources of strokes 5. Carotid dopplers: Preliminary report 1-39% stenosis bilaterally, vertebrals are anti-flow. 6. Prophylactic therapy-Antiplatelet med: Aspirin 325 mg 7. Risk factor modification: Controlled blood pressure and  cholesterol. Neurology reported possible stroke, recommended follow-up with rheumatology as outpatient to rule out cervical radiculopathy.  Chest tightness This is likely none cardiac chest pain. Troponins negative 3 and -12-lead EKG for ischemic changes.  Essential hypertension Benign essential hypertension, medications held on admission, restarted on discharge.  Tobacco abuse disorder Counseled extensively about tobacco abuse.  MCTD On Plaquenil continued.   Procedures:  2-D echo and carotid duplex without evidence of embolic sources, LVEF is 09% no diastolic dysfunction or WMA.  Consultations:  Stable  Discharge Exam: Filed Vitals:   12/02/14 0936  BP: 136/91  Pulse: 65  Temp: 98.6 F (37 C)  Resp: 18   General: Alert and awake, oriented x3, not in any acute distress. HEENT: anicteric sclera, pupils reactive to light and accommodation, EOMI CVS: S1-S2 clear, no murmur rubs or gallops Chest: clear to auscultation bilaterally, no wheezing, rales or rhonchi Abdomen: soft nontender, nondistended, normal bowel sounds, no organomegaly Extremities: no cyanosis, clubbing or edema noted bilaterally Neuro: Cranial nerves II-XII intact, no focal neurological deficits  Discharge Instructions   Discharge Instructions    Diet - low sodium heart healthy    Complete by:  As directed      Diet - low sodium heart healthy    Complete by:  As directed      Increase activity slowly    Complete by:  As directed      Increase activity slowly    Complete by:  As directed           Current Discharge Medication List    START taking these medications   Details  aspirin 325 MG tablet Take 1 tablet (  325 mg total) by mouth daily.    atorvastatin (LIPITOR) 20 MG tablet Take 1 tablet (20 mg total) by mouth daily at 6 PM. Qty: 30 tablet, Refills: 0      CONTINUE these medications which have NOT CHANGED   Details  hydrochlorothiazide (HYDRODIURIL) 25 MG tablet Take 1 tablet (25  mg total) by mouth daily. Qty: 30 tablet, Refills: 0    hydroxychloroquine (PLAQUENIL) 200 MG tablet Take 200 mg by mouth 2 (two) times daily.    leucovorin (WELLCOVORIN) 5 MG tablet Take 5 mg by mouth every 12 (twelve) hours. For 3 days after chemo    lisinopril (PRINIVIL,ZESTRIL) 10 MG tablet Take 10 mg by mouth daily.    TREXALL 10 MG tablet Take 20 mg by mouth once a week. Monday Refills: 5       Allergies  Allergen Reactions  . Ketoconazole Rash   Follow-up Information    Follow up with [facility] In 1 week.   Specialty:  General Practice   Contact information:   [address] [place] [address] [phone]        The results of significant diagnostics from this hospitalization (including imaging, microbiology, ancillary and laboratory) are listed below for reference.    Significant Diagnostic Studies: Dg Chest 2 View  11/30/2014   CLINICAL DATA:  Chest pain and LEFT arm numbness.  Acute onset  EXAM: CHEST  2 VIEW  COMPARISON:  02/02/2014  FINDINGS: Normal mediastinum and cardiac silhouette. Normal pulmonary vasculature. No evidence of effusion, infiltrate, or pneumothorax. No acute bony abnormality.  IMPRESSION: No acute cardiopulmonary process.   Electronically Signed   By: Suzy Bouchard M.D.   On: 11/30/2014 13:56   Ct Head Wo Contrast  11/30/2014   CLINICAL DATA:  Left arm numbness today.  EXAM: CT HEAD WITHOUT CONTRAST  TECHNIQUE: Contiguous axial images were obtained from the base of the skull through the vertex without intravenous contrast.  COMPARISON:  None.  FINDINGS: Normal appearing cerebral hemispheres and posterior fossa structures. Normal size and position of the ventricles. No intracranial hemorrhage, mass lesion or CT evidence of acute infarction. Unremarkable bones and included paranasal sinuses.  IMPRESSION: Normal examination.   Electronically Signed   By: Claudie Revering M.D.   On: 11/30/2014 14:52   Mr Brain Wo  Contrast  11/30/2014   CLINICAL DATA:  LEFT-sided weakness and numbness, difficulty speaking, follow-up code stroke. History of hypertension, lupus.  EXAM: MRI HEAD WITHOUT CONTRAST  MRA HEAD WITHOUT CONTRAST  TECHNIQUE: Multiplanar, multiecho pulse sequences of the brain and surrounding structures were obtained without intravenous contrast. Angiographic images of the head were obtained using MRA technique without contrast.  COMPARISON:  CT head November 30, 2014 at 1436 hours.  FINDINGS: MRI HEAD FINDINGS  Mildly motion degraded examination.  The ventricles and sulci are normal for patient's age. No abnormal parenchymal signal, mass lesions, mass effect. No reduced diffusion to suggest acute ischemia. No susceptibility artifact to suggest hemorrhage.  No abnormal extra-axial fluid collections. No extra-axial masses though, contrast enhanced sequences would be more sensitive. Normal major intracranial vascular flow voids seen at the skull base. 5 mm pineal cyst.  Ocular globes and orbital contents are unremarkable though not tailored for evaluation. No abnormal sellar expansion. Visualized paranasal sinuses and mastoid air cells are well-aerated. No suspicious calvarial bone marrow signal. No abnormal sellar expansion. Craniocervical junction maintained.  MRA HEAD FINDINGS  Moderately motion degraded examination.  The internal carotid arteries appear patent,  limited assessment of the circle of Willis due to motion. Anterior and middle cerebral arteries demonstrate normal flow related enhancement.  Codominant appearing vertebral arteries with normal flow related enhancement of basilar artery, posterior cerebral arteries.  No acute large vessel occlusion or definite high-grade stenosis though motion limits evaluation. Limited assessment for aneurysm.  IMPRESSION: MRI HEAD: Negative noncontrast MRI brain, specifically no acute ischemia.  MRA HEAD: Moderately motion degraded examination without acute large vessel  occlusion.   Electronically Signed   By: Elon Alas M.D.   On: 11/30/2014 23:51   Mr Jodene Nam Head/brain Wo Cm  11/30/2014   CLINICAL DATA:  LEFT-sided weakness and numbness, difficulty speaking, follow-up code stroke. History of hypertension, lupus.  EXAM: MRI HEAD WITHOUT CONTRAST  MRA HEAD WITHOUT CONTRAST  TECHNIQUE: Multiplanar, multiecho pulse sequences of the brain and surrounding structures were obtained without intravenous contrast. Angiographic images of the head were obtained using MRA technique without contrast.  COMPARISON:  CT head November 30, 2014 at 1436 hours.  FINDINGS: MRI HEAD FINDINGS  Mildly motion degraded examination.  The ventricles and sulci are normal for patient's age. No abnormal parenchymal signal, mass lesions, mass effect. No reduced diffusion to suggest acute ischemia. No susceptibility artifact to suggest hemorrhage.  No abnormal extra-axial fluid collections. No extra-axial masses though, contrast enhanced sequences would be more sensitive. Normal major intracranial vascular flow voids seen at the skull base. 5 mm pineal cyst.  Ocular globes and orbital contents are unremarkable though not tailored for evaluation. No abnormal sellar expansion. Visualized paranasal sinuses and mastoid air cells are well-aerated. No suspicious calvarial bone marrow signal. No abnormal sellar expansion. Craniocervical junction maintained.  MRA HEAD FINDINGS  Moderately motion degraded examination.  The internal carotid arteries appear patent, limited assessment of the circle of Willis due to motion. Anterior and middle cerebral arteries demonstrate normal flow related enhancement.  Codominant appearing vertebral arteries with normal flow related enhancement of basilar artery, posterior cerebral arteries.  No acute large vessel occlusion or definite high-grade stenosis though motion limits evaluation. Limited assessment for aneurysm.  IMPRESSION: MRI HEAD: Negative noncontrast MRI brain,  specifically no acute ischemia.  MRA HEAD: Moderately motion degraded examination without acute large vessel occlusion.   Electronically Signed   By: Elon Alas M.D.   On: 11/30/2014 23:51    Microbiology: No results found for this or any previous visit (from the past 240 hour(s)).   Labs: Basic Metabolic Panel:  Recent Labs Lab 11/30/14 1400  NA 139  K 4.3  CL 104  CO2 30  GLUCOSE 87  BUN 7  CREATININE 1.10  CALCIUM 9.6   Liver Function Tests: No results for input(s): AST, ALT, ALKPHOS, BILITOT, PROT, ALBUMIN in the last 168 hours. No results for input(s): LIPASE, AMYLASE in the last 168 hours. No results for input(s): AMMONIA in the last 168 hours. CBC:  Recent Labs Lab 11/30/14 1400  WBC 4.4  NEUTROABS PENDING  HGB 14.8  HCT 42.3  MCV 90.4  PLT 322   Cardiac Enzymes:  Recent Labs Lab 12/01/14 0611  TROPONINI <0.03   BNP: BNP (last 3 results) No results for input(s): BNP in the last 8760 hours.  ProBNP (last 3 results) No results for input(s): PROBNP in the last 8760 hours.  CBG:  Recent Labs Lab 11/30/14 1425  GLUCAP 86       Signed:  Mauro Arps A  [facility] 12/02/2014, 10:12 AM

## 2014-12-02 NOTE — Progress Notes (Signed)
STROKE TEAM PROGRESS NOTE   SUBJECTIVE (INTERVAL HISTORY) Patient up in chair at the bedside. No complaints. Ready to go home. Deficit all resolved.   OBJECTIVE Temp:  [97.7 F (36.5 C)-98.6 F (37 C)] 98.6 F (37 C) (09/05 0936) Pulse Rate:  [52-69] 65 (09/05 0936) Cardiac Rhythm:  [-] Sinus bradycardia (09/05 0700) Resp:  [18] 18 (09/05 0936) BP: (133-152)/(76-91) 136/91 mmHg (09/05 0936) SpO2:  [97 %-100 %] 99 % (09/05 0936)  CBC:   Recent Labs Lab 11/30/14 1400  WBC 4.4  NEUTROABS PENDING  HGB 14.8  HCT 42.3  MCV 90.4  PLT 253    Basic Metabolic Panel:   Recent Labs Lab 11/30/14 1400  NA 139  K 4.3  CL 104  CO2 30  GLUCOSE 87  BUN 7  CREATININE 1.10  CALCIUM 9.6    Lipid Panel:     Component Value Date/Time   CHOL 161 12/01/2014 0611   TRIG 137 12/01/2014 0611   HDL 40* 12/01/2014 0611   CHOLHDL 4.0 12/01/2014 0611   VLDL 27 12/01/2014 0611   LDLCALC 94 12/01/2014 0611   HgbA1c: No results found for: HGBA1C Urine Drug Screen:     Component Value Date/Time   LABOPIA NONE DETECTED 11/30/2014 1544   COCAINSCRNUR NONE DETECTED 11/30/2014 1544   LABBENZ NONE DETECTED 11/30/2014 1544   AMPHETMU NONE DETECTED 11/30/2014 1544   THCU POSITIVE* 11/30/2014 1544   LABBARB NONE DETECTED 11/30/2014 1544      IMAGING I have personally reviewed the radiological images below and agree with the radiology interpretations.  Dg Chest 2 View 11/30/2014    No acute cardiopulmonary process.  Ct Head Wo Contrast 11/30/2014     Normal examination.    MRI HEAD:  11/30/2014    Negative noncontrast MRI brain, specifically no acute ischemia.    MRA HEAD:  11/30/2014    Moderately motion degraded examination without acute large vessel occlusion.     2D Echocardiogram   - Left ventricle: The cavity size was normal. Systolic function wasnormal. The estimated ejection fraction was in the range of 55%to 60%. Wall motion was normal; there were no regional wallmotion  abnormalities. Left ventricular diastolic functionparameters were normal. - Left atrium: The atrium was mildly dilated. - Atrial septum: A patent foramen ovale cannot be excluded.  Carotid Doppler  There is 1-39% bilateral ICA stenosis. Vertebral artery flow is antegrade.    LE venous dopplers negative for DVT   PHYSICAL EXAM Physical Exam General - Well nourished, well developed, in NAD   Cardiovascular - Regular rate and rhythm Pulmonary: CTA Abdomen: NT, ND, normal bowel sounds Extremities: No C/C/E  Neurological Exam Mental Status: Normal Orientation:  Oriented to person, place and time Speech:  Fluent; no dysarthria Cranial Nerves:  PERRL; EOMI; visual fields full, face grossly symmetric, hearing grossly intact; shrug symmetric and tongue midline Motor Exam:  Tone:  Within normal limits; Strength: 5/5 throughout Sensory: Intact to light touch throughout; except slight decrease in lateral right thhigh Coordination:  Intact finger to nose Gait: Deferred   ASSESSMENT/PLAN Thomas Willis is a 49 y.o. male with history of HTN, smoking, Lupus, pancreatitis, chronic low back pain due to L4, L5 disc disease presenting with chest tightness, left arm numbness, slurred speech, gait disturbance left leg, and left hand spasm. He did not receive IV t-PA due to minimal deficits.  Possible TIA  Resultant  resolution of deficits  MRI  negative  MRA  negative  Carotid Doppler  No significant stenosis   2D Echo  No source of embolus   LE venous dopplers neg  Hypercoagulable and autoimmune work up pending  LDL 94  HgbA1c pending  Lovenox for VTE prophylaxis Diet Heart Room service appropriate?: Yes; Fluid consistency:: Thin Diet - low sodium heart healthy  no antithrombotic prior to admission, now on aspirin 325 mg orally every day. Continue ASA on discharge.  Patient counseled to be compliant with his antithrombotic medications  Ongoing aggressive stroke  risk factor management  Therapy recommendations: No follow-up PT or OT  Disposition: return home  ? Lupus  Have hx of joint pain, rashes  On plaquenil and methoxate.    hyperocagulable work up to rule out anti- phospholipid syndrome  Autoimmune work up also pending.  Essential Hypertension  Normotensive goals at discharge  Hyperlipidemia  Home meds: No lipid lowering medications prior to admission.  LDL 94, goal < 70  new Lipitor 20 mg daily  Continue statin at discharge  Tobacco abuse  Current smoker  Smoking cessation counseling provided  Nicotine patch provided  Pt is willing to quit  Other Stroke Risk Factors  Marijuana use  Hospital day # 2  Neurology will sign off. Please call with questions. Pt will follow up with Dr. Erlinda Hong at [facility] in about 2 months. Thanks for the consult.   Rosalin Hawking, MD PhD Stroke Neurology 12/02/2014 10:39 PM   To contact Stroke Continuity provider, please refer to [email or web address] After hours, contact General Neurology

## 2014-12-02 NOTE — Progress Notes (Signed)
D/C orders received, pt for D/C home today.  IV and telemetry D/C.  Rx and D/C instructions given with verbalized understanding.  Family at bedside to assist with D/C.  Staff brought pt downstairs via wheelchair.  

## 2014-12-03 LAB — HEMOGLOBIN A1C
Hgb A1c MFr Bld: 5.7 % — ABNORMAL HIGH (ref 4.8–5.6)
MEAN PLASMA GLUCOSE: 117 mg/dL

## 2014-12-03 LAB — BETA-2-GLYCOPROTEIN I ABS, IGG/M/A
Beta-2-Glycoprotein I IgA: <9 GPI IgA units (ref 0–25)
Beta-2-Glycoprotein I IgM: <9 GPI IgM units (ref 0–32)

## 2014-12-03 LAB — ANCA TITERS
Atypical P-ANCA titer: <1:20 {titer}
C-ANCA: <1:20 {titer}
P-ANCA: <1:20 {titer}

## 2014-12-03 LAB — PROTEIN C, TOTAL: PROTEIN C, TOTAL: 120 % (ref 70–140)

## 2014-12-03 LAB — ANTI-DNA ANTIBODY, DOUBLE-STRANDED: ds DNA Ab: 5 IU/mL (ref 0–9)

## 2014-12-03 LAB — SJOGRENS SYNDROME-B EXTRACTABLE NUCLEAR ANTIBODY

## 2014-12-03 LAB — ANTI-SMITH ANTIBODY: ENA SM Ab Ser-aCnc: <0.2 AI (ref 0.0–0.9)

## 2014-12-03 LAB — C4 COMPLEMENT: COMPLEMENT C4, BODY FLUID: 18 mg/dL (ref 14–44)

## 2014-12-03 LAB — C3 COMPLEMENT: C3 COMPLEMENT: 103 mg/dL (ref 82–167)

## 2014-12-03 LAB — RHEUMATOID FACTOR

## 2014-12-03 LAB — SJOGRENS SYNDROME-A EXTRACTABLE NUCLEAR ANTIBODY: SSA (RO) (ENA) ANTIBODY, IGG: 0.7 AI (ref 0.0–0.9)

## 2014-12-04 LAB — CARDIOLIPIN ANTIBODIES, IGG, IGM, IGA
ANTICARDIOLIPIN IGM: 23 [MPL'U]/mL — AB (ref 0–12)
Anticardiolipin IgG: <9 GPL U/mL (ref 0–14)

## 2014-12-04 LAB — HOMOCYSTEINE: Homocysteine: 10.5 umol/L (ref 0.0–15.0)

## 2014-12-04 LAB — LUPUS ANTICOAGULANT PANEL
DRVVT: 43.5 s (ref 0.0–55.1)
PTT Lupus Anticoagulant: 48.8 s (ref 0.0–50.0)

## 2014-12-04 LAB — PROTEIN S ACTIVITY: PROTEIN S ACTIVITY: 90 % (ref 60–145)

## 2014-12-04 LAB — PROTEIN C ACTIVITY: PROTEIN C ACTIVITY: 148 % (ref 74–151)

## 2014-12-04 LAB — PROTEIN S, TOTAL: Protein S Ag, Total: 116 % (ref 58–150)

## 2014-12-04 LAB — ANTINUCLEAR ANTIBODIES, IFA: ANA Ab, IFA: NEGATIVE

## 2014-12-09 LAB — FACTOR 5 LEIDEN

## 2014-12-09 LAB — PROTHROMBIN GENE MUTATION

## 2015-01-14 DIAGNOSIS — G459 Transient cerebral ischemic attack, unspecified: Secondary | ICD-10-CM | POA: Insufficient documentation

## 2015-01-26 ENCOUNTER — Emergency Department (HOSPITAL_COMMUNITY)
Admission: EM | Admit: 2015-01-26 | Discharge: 2015-01-26 | Disposition: A | Discharge status: Home / Self Care | Payer: No Typology Code available for payment source | Attending: Emergency Medicine | Admitting: Emergency Medicine

## 2015-01-26 ENCOUNTER — Encounter (HOSPITAL_COMMUNITY): Payer: Self-pay | Admitting: *Deleted

## 2015-01-26 ENCOUNTER — Emergency Department (HOSPITAL_COMMUNITY): Payer: No Typology Code available for payment source

## 2015-01-26 DIAGNOSIS — I10 Essential (primary) hypertension: Secondary | ICD-10-CM | POA: Insufficient documentation

## 2015-01-26 DIAGNOSIS — Z720 Tobacco use: Secondary | ICD-10-CM | POA: Diagnosis not present

## 2015-01-26 DIAGNOSIS — Y92410 Unspecified street and highway as the place of occurrence of the external cause: Secondary | ICD-10-CM | POA: Insufficient documentation

## 2015-01-26 DIAGNOSIS — Z7982 Long term (current) use of aspirin: Secondary | ICD-10-CM | POA: Insufficient documentation

## 2015-01-26 DIAGNOSIS — Z862 Personal history of diseases of the blood and blood-forming organs and certain disorders involving the immune mechanism: Secondary | ICD-10-CM | POA: Diagnosis not present

## 2015-01-26 DIAGNOSIS — Y9389 Activity, other specified: Secondary | ICD-10-CM | POA: Diagnosis not present

## 2015-01-26 DIAGNOSIS — Z8719 Personal history of other diseases of the digestive system: Secondary | ICD-10-CM | POA: Insufficient documentation

## 2015-01-26 DIAGNOSIS — M1990 Unspecified osteoarthritis, unspecified site: Secondary | ICD-10-CM | POA: Insufficient documentation

## 2015-01-26 DIAGNOSIS — S0990XA Unspecified injury of head, initial encounter: Secondary | ICD-10-CM | POA: Diagnosis not present

## 2015-01-26 DIAGNOSIS — G8929 Other chronic pain: Secondary | ICD-10-CM | POA: Insufficient documentation

## 2015-01-26 DIAGNOSIS — S3992XA Unspecified injury of lower back, initial encounter: Secondary | ICD-10-CM | POA: Diagnosis not present

## 2015-01-26 DIAGNOSIS — M542 Cervicalgia: Secondary | ICD-10-CM

## 2015-01-26 DIAGNOSIS — S199XXA Unspecified injury of neck, initial encounter: Secondary | ICD-10-CM | POA: Insufficient documentation

## 2015-01-26 DIAGNOSIS — Y998 Other external cause status: Secondary | ICD-10-CM | POA: Diagnosis not present

## 2015-01-26 DIAGNOSIS — Z79899 Other long term (current) drug therapy: Secondary | ICD-10-CM | POA: Diagnosis not present

## 2015-01-26 MED ORDER — IBUPROFEN 800 MG PO TABS
800.0000 mg | ORAL_TABLET | Freq: Once | ORAL | Status: AC
  Administered 2015-01-26: 800 mg via ORAL
  Filled 2015-01-26: qty 1

## 2015-01-26 NOTE — Discharge Instructions (Signed)
You may continue taking ibuprofen over-the-counter for pain relief. Follow-up with your primary care provider in one week. Return to the emergency department if symptoms worsen or new onset of fever, headaches, numbness, tingling, weakness.
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
## 2015-11-06 ENCOUNTER — Emergency Department (HOSPITAL_COMMUNITY)
Admission: EM | Admit: 2015-11-06 | Discharge: 2015-11-07 | Disposition: A | Discharge status: Home / Self Care | Payer: Medicaid Other | Attending: Emergency Medicine | Admitting: Emergency Medicine

## 2015-11-06 ENCOUNTER — Encounter (HOSPITAL_COMMUNITY): Payer: Self-pay | Admitting: Emergency Medicine

## 2015-11-06 DIAGNOSIS — F17210 Nicotine dependence, cigarettes, uncomplicated: Secondary | ICD-10-CM | POA: Diagnosis not present

## 2015-11-06 DIAGNOSIS — R200 Anesthesia of skin: Secondary | ICD-10-CM | POA: Insufficient documentation

## 2015-11-06 DIAGNOSIS — Z79899 Other long term (current) drug therapy: Secondary | ICD-10-CM | POA: Insufficient documentation

## 2015-11-06 DIAGNOSIS — Z8673 Personal history of transient ischemic attack (TIA), and cerebral infarction without residual deficits: Secondary | ICD-10-CM | POA: Insufficient documentation

## 2015-11-06 DIAGNOSIS — Z5181 Encounter for therapeutic drug level monitoring: Secondary | ICD-10-CM | POA: Diagnosis not present

## 2015-11-06 DIAGNOSIS — I10 Essential (primary) hypertension: Secondary | ICD-10-CM | POA: Diagnosis not present

## 2015-11-06 HISTORY — DX: Transient cerebral ischemic attack, unspecified: G45.9

## 2015-11-06 NOTE — ED Provider Notes (Signed)
[facility] DEPT Provider Note   CSN: [id number] Arrival date & time: 11/06/15  2305  First Provider Contact:  First MD Initiated Contact with Patient 11/06/15 2339     By signing my name below, I, Emmanuella Mensah, attest that this documentation has been prepared under the direction and in the presence of Shanon Rosser, MD. Electronically Signed: Judithann Sauger, ED Scribe. 11/06/15. 11:50 PM.    History   Chief Complaint Chief Complaint  Patient presents with  . Numbness    HPI Comments: Thomas Willis is a 50 y.o. male with a hx of arthritis, hypertension, lupus, CVA, TIA, and left-sided weakness who presents to the Emergency Department complaining of gradual onset of ongoing numbness to his left arm that radiates up to his left shoulder onset two days ago. He describes the numbness as a feeling of swelling or muscle spasm. He has a known cervical radiculopathy causing chronic right-sided numbness and weakness which has not acutely changed. No alleviating factors noted. Pt has not tried any medications PTA. No fever, chills, headache, nausea or vomiting.  .  The history is provided by the patient. No language interpreter was used.    Past Medical History:  Diagnosis Date  . Arthritis   . Back pain, chronic   . Blood disorder    LPC, treated at [facility]  . Hypertension   . Lumbar herniated disc    L4, L5  . Lupus ([place])   . Pancreatitis   . TIA (transient ischemic attack) 2016    Patient Active Problem List   Diagnosis Date Noted  . TIA (transient ischemic attack)   . CVA (cerebral infarction) 12/02/2014  . Left-sided weakness 11/30/2014  . Essential hypertension, benign 11/30/2014  . Tobacco abuse 11/30/2014  . Mixed connective tissue disease ([place]) 11/30/2014  . LEFT VENTRICULAR FUNCTION, DECREASED 01/01/2010  . OTH NONSPECIFIC ABNORM CV SYSTEM FUNCTION STUDY 01/01/2010    Past Surgical History:  Procedure Laterality Date  . COLONOSCOPY WITH PROPOFOL N/A  09/12/2014   Procedure: COLONOSCOPY WITH PROPOFOL;  Surgeon: Hulen Luster, MD;  Location[facility] ENDOSCOPY;  Service: Gastroenterology;  Laterality: N/A;  . ESOPHAGOGASTRODUODENOSCOPY N/A 09/12/2014   Procedure: ESOPHAGOGASTRODUODENOSCOPY (EGD);  Surgeon: Hulen Luster, MD;  Location: [facility] ENDOSCOPY;  Service: Gastroenterology;  Laterality: N/A;  . HERNIA REPAIR    . LUMBAR EPIDURAL INJECTION         Home Medications    Prior to Admission medications   Medication Sig Start Date End Date Taking? Authorizing Provider  hydroxychloroquine (PLAQUENIL) 200 MG tablet Take 1 tablet by mouth 2 (two) times daily. 10/30/15  Yes Historical Provider, MD  leucovorin (WELLCOVORIN) 5 MG tablet Take 1 tablet by mouth every 12 (twelve) hours. 10/07/15  Yes Historical Provider, MD  lisinopril (PRINIVIL,ZESTRIL) 10 MG tablet Take 10 mg by mouth daily. 09/17/15  Yes Historical Provider, MD  methotrexate (RHEUMATREX) 10 MG tablet Take 20 mg by mouth every 7 (seven) days. 10/07/15  Yes Historical Provider, MD  aspirin 325 MG tablet Take 1 tablet (325 mg total) by mouth daily. Patient not taking: Reported on 11/06/2015 12/02/14   Verlee Monte, MD  atorvastatin (LIPITOR) 20 MG tablet Take 1 tablet (20 mg total) by mouth daily at 6 PM. Patient not taking: Reported on 11/06/2015 12/02/14   Verlee Monte, MD  hydrochlorothiazide (HYDRODIURIL) 25 MG tablet Take 1 tablet (25 mg total) by mouth daily. Patient not taking: Reported on 11/06/2015 06/19/13   Shanon Rosser, MD    Family History Family  History  Problem Relation Age of Onset  . Hypertension    . CAD      Social History Social History  Substance Use Topics  . Smoking status: Light Tobacco Smoker    Types: Cigarettes  . Smokeless tobacco: Never Used  . Alcohol use No     Allergies   Ketoconazole   Review of Systems Review of Systems  Constitutional: Negative for chills and fever.  Musculoskeletal: Positive for neck stiffness.  Skin: Negative for rash and wound.    Neurological: Positive for weakness.  All other systems reviewed and are negative.    Physical Exam Updated Vital Signs BP (!) 152/104 (BP Location: Left Arm)   Pulse (!) 53   Temp 98.2 F (36.8 C)   Resp 13   Ht 5\' 10"  (1.778 m)   Wt 190 lb (86.2 kg)   SpO2 100%   BMI 27.26 kg/m   Physical Exam  Nursing note and vitals reviewed.  General: Well-developed, well-nourished male in no acute distress; appearance consistent with age of record HENT: normocephalic; atraumatic Eyes: pupils equal, round and reactive to light; extraocular muscles intact Neck: supple; no bruit Heart: regular rate and rhythm; no murmurs, rubs or gallops Lungs: clear to auscultation bilaterally Abdomen: soft; nondistended; nontender; no masses or hepatosplenomegaly; bowel sounds present Extremities: No deformity; full range of motion; pulses normal Neurologic: Awake, alert and oriented; motor function intact in all extremities and symmetric; no facial droop; sensation intact bilaterally but subjectively altered on left side, most prominently on left hand; no pronator drift, normal finger to nose Skin: Warm and dry Psychiatric: Normal mood and affect   ED Treatments / Results   Nursing notes and vitals signs, including pulse oximetry, reviewed.  Summary of this visit's results, reviewed by myself:  Labs:  Results for orders placed or performed during the hospital encounter of 11/06/15 (from the past 24 hour(s))  Ethanol     Status: None   Collection Time: 11/06/15 11:35 PM  Result Value Ref Range   Alcohol, Ethyl (B) <5 <5 mg/dL  Protime-INR     Status: None   Collection Time: 11/06/15 11:35 PM  Result Value Ref Range   Prothrombin Time 13.5 11.4 - 15.2 seconds   INR 1.03   APTT     Status: Abnormal   Collection Time: 11/06/15 11:35 PM  Result Value Ref Range   aPTT 37 (H) 24 - 36 seconds  CBC     Status: Abnormal   Collection Time: 11/06/15 11:35 PM  Result Value Ref Range   WBC 4.6 4.0 -  10.5 K/uL   RBC 4.34 4.22 - 5.81 MIL/uL   Hemoglobin 14.0 13.0 - 17.0 g/dL   HCT 38.2 (L) 39.0 - 52.0 %   MCV 88.0 78.0 - 100.0 fL   MCH 32.3 26.0 - 34.0 pg   MCHC 36.6 (H) 30.0 - 36.0 g/dL   RDW 12.7 11.5 - 15.5 %   Platelets 279 150 - 400 K/uL  Differential     Status: None   Collection Time: 11/06/15 11:35 PM  Result Value Ref Range   Neutrophils Relative % 44 %   Neutro Abs 2.1 1.7 - 7.7 K/uL   Lymphocytes Relative 37 %   Lymphs Abs 1.7 0.7 - 4.0 K/uL   Monocytes Relative 13 %   Monocytes Absolute 0.6 0.1 - 1.0 K/uL   Eosinophils Relative 5 %   Eosinophils Absolute 0.2 0.0 - 0.7 K/uL   Basophils Relative 1 %  Basophils Absolute 0.0 0.0 - 0.1 K/uL  Comprehensive metabolic panel     Status: Abnormal   Collection Time: 11/06/15 11:35 PM  Result Value Ref Range   Sodium 140 135 - 145 mmol/L   Potassium 3.5 3.5 - 5.1 mmol/L   Chloride 105 101 - 111 mmol/L   CO2 27 22 - 32 mmol/L   Glucose, Bld 106 (H) 65 - 99 mg/dL   BUN 10 6 - 20 mg/dL   Creatinine, Ser 1.16 0.61 - 1.24 mg/dL   Calcium 9.1 8.9 - 10.3 mg/dL   Total Protein 6.8 6.5 - 8.1 g/dL   Albumin 4.0 3.5 - 5.0 g/dL   AST 33 15 - 41 U/L   ALT 26 17 - 63 U/L   Alkaline Phosphatase 53 38 - 126 U/L   Total Bilirubin 0.8 0.3 - 1.2 mg/dL   GFR calc non Af Amer >60 >60 mL/min   GFR calc Af Amer >60 >60 mL/min   Anion gap 8 5 - 15  Urine rapid drug screen (hosp performed)not at [facility]     Status: Abnormal   Collection Time: 11/06/15 11:49 PM  Result Value Ref Range   Opiates NONE DETECTED NONE DETECTED   Cocaine NONE DETECTED NONE DETECTED   Benzodiazepines NONE DETECTED NONE DETECTED   Amphetamines NONE DETECTED NONE DETECTED   Tetrahydrocannabinol POSITIVE (A) NONE DETECTED   Barbiturates NONE DETECTED NONE DETECTED  Urinalysis, Routine w reflex microscopic (not at [facility])     Status: None   Collection Time: 11/06/15 11:49 PM  Result Value Ref Range   Color, Urine YELLOW YELLOW   APPearance CLEAR CLEAR   Specific  Gravity, Urine 1.010 1.005 - 1.030   pH 6.0 5.0 - 8.0   Glucose, UA NEGATIVE NEGATIVE mg/dL   Hgb urine dipstick NEGATIVE NEGATIVE   Bilirubin Urine NEGATIVE NEGATIVE   Ketones, ur NEGATIVE NEGATIVE mg/dL   Protein, ur NEGATIVE NEGATIVE mg/dL   Nitrite NEGATIVE NEGATIVE   Leukocytes, UA NEGATIVE NEGATIVE  I-stat troponin, ED (not at [facility])     Status: None   Collection Time: 11/06/15 11:59 PM  Result Value Ref Range   Troponin i, poc 0.00 0.00 - 0.08 ng/mL   Comment 3            Imaging Studies: Ct Head Wo Contrast  Result Date: 11/07/2015 CLINICAL DATA:  Acute onset of left arm numbness. Initial encounter. EXAM: CT HEAD WITHOUT CONTRAST TECHNIQUE: Contiguous axial images were obtained from the base of the skull through the vertex without intravenous contrast. COMPARISON:  MRI/MRA of the brain and CT of the head performed 11/30/2014 FINDINGS: There is no evidence of acute infarction, mass lesion, or intra- or extra-axial hemorrhage on CT. The posterior fossa, including the cerebellum, brainstem and fourth ventricle, is within normal limits. The third and lateral ventricles, and basal ganglia are unremarkable in appearance. The cerebral hemispheres are symmetric in appearance, with normal gray-white differentiation. No mass effect or midline shift is seen. There is no evidence of fracture; visualized osseous structures are unremarkable in appearance. The visualized portions of the orbits are within normal limits. The paranasal sinuses and mastoid air cells are well-aerated. No significant soft tissue abnormalities are seen. IMPRESSION: Unremarkable noncontrast CT of the head. Electronically Signed   By: Garald Balding M.D.   On: 11/07/2015 00:34   Mr Brain Wo Contrast  Result Date: 11/07/2015 CLINICAL DATA:  Initial evaluation for acute left arm numbness for 2 days. History of previous TIA.  EXAM: MRI HEAD WITHOUT CONTRAST TECHNIQUE: Multiplanar, multiecho pulse sequences of the brain and  surrounding structures were obtained without intravenous contrast. COMPARISON:  Prior CT from earlier the same day. FINDINGS: Cerebral volume normal for patient age. No significant cerebral white matter disease present. No abnormal foci of restricted diffusion to suggest acute ischemia. Gray-white matter differentiation is well maintained. Major intracranial vascular flow voids are well preserved. No acute or chronic intracranial hemorrhage. No areas of chronic infarction. No mass lesion, midline shift, or mass effect. No hydrocephalus. No extra-axial fluid collection. Major dural sinuses are grossly patent. Craniocervical junction within normal limits. Visualized upper cervical spine unremarkable without significant degenerative changes are stenosis. Pituitary gland within normal limits. No acute abnormality about the globes and orbits. Paranasal sinuses are clear. No mastoid effusion. Inner ear structures within normal limits. Bone marrow signal intensity within normal limits. No scalp soft tissue abnormality. IMPRESSION: Normal brain MRI. No evidence for acute intracranial infarct or other abnormality identified. Electronically Signed   By: Jeannine Boga M.D.   On: 11/07/2015 07:22    EKG  EKG Interpretation  Date/Time:  Thursday November 06 2015 23:13:13 EDT Ventricular Rate:  51 PR Interval:    QRS Duration: 118 QT Interval:  441 QTC Calculation: 407 R Axis:   -11 Text Interpretation:  Sinus bradycardia Borderline prolonged PR interval Nonspecific intraventricular conduction delay No significant change was found Confirmed by Allyson Tineo  MD, Jenny Reichmann ([id number]) on 11/06/2015 11:16:36 PM      7:28 AM The patient's MRI shows no evidence of an acute stroke. His left-sided numbness is nearly completely resolved. This was discussed with Dr. Tasia Catchings the neurologist on-call. His TIA workup last year was reassuring. Dr. Tasia Catchings advises the patient be placed on an aspirin daily and to follow-up with his  PCP. He has an appointment pending with Dr. Norris Cross. He was advised to return for worsening symptoms.  Procedures (including critical care time)   Final Clinical Impressions(s) / ED Diagnoses   Final diagnoses:  Left sided numbness   I personally performed the services described in this documentation, which was scribed in my presence. The recorded information has been reviewed and is accurate.    Shanon Rosser, MD 11/07/15 [phone]

## 2015-11-06 NOTE — ED Triage Notes (Signed)
Pt stated 2 days ago he was sitting down and had sudden onset of numbness in his left arm starting at the pectoralis muscle extending down to his hand.  Pt has active range of motion in affected arm.  Able to wiggle fingers.  Denies any other symptoms.  Pt states he had a TIA last year.

## 2015-11-07 ENCOUNTER — Emergency Department (HOSPITAL_COMMUNITY): Payer: Medicaid Other

## 2015-11-07 LAB — DIFFERENTIAL
BASOS PCT: 1 %
Basophils Absolute: 0 10*3/uL (ref 0.0–0.1)
Eosinophils Absolute: 0.2 10*3/uL (ref 0.0–0.7)
Eosinophils Relative: 5 %
LYMPHS ABS: 1.7 10*3/uL (ref 0.7–4.0)
Lymphocytes Relative: 37 %
MONO ABS: 0.6 10*3/uL (ref 0.1–1.0)
MONOS PCT: 13 %
Neutro Abs: 2.1 10*3/uL (ref 1.7–7.7)
Neutrophils Relative %: 44 %

## 2015-11-07 LAB — CBC
HEMATOCRIT: 38.2 % — AB (ref 39.0–52.0)
HEMOGLOBIN: 14 g/dL (ref 13.0–17.0)
MCH: 32.3 pg (ref 26.0–34.0)
MCHC: 36.6 g/dL — ABNORMAL HIGH (ref 30.0–36.0)
MCV: 88 fL (ref 78.0–100.0)
Platelets: 279 10*3/uL (ref 150–400)
RBC: 4.34 MIL/uL (ref 4.22–5.81)
RDW: 12.7 % (ref 11.5–15.5)
WBC: 4.6 10*3/uL (ref 4.0–10.5)

## 2015-11-07 LAB — RAPID URINE DRUG SCREEN, HOSP PERFORMED
AMPHETAMINES: NOT DETECTED
Barbiturates: NOT DETECTED
Benzodiazepines: NOT DETECTED
COCAINE: NOT DETECTED
OPIATES: NOT DETECTED
TETRAHYDROCANNABINOL: POSITIVE — AB

## 2015-11-07 LAB — COMPREHENSIVE METABOLIC PANEL
ALBUMIN: 4 g/dL (ref 3.5–5.0)
ALK PHOS: 53 U/L (ref 38–126)
ALT: 26 U/L (ref 17–63)
ANION GAP: 8 (ref 5–15)
AST: 33 U/L (ref 15–41)
BUN: 10 mg/dL (ref 6–20)
CALCIUM: 9.1 mg/dL (ref 8.9–10.3)
CHLORIDE: 105 mmol/L (ref 101–111)
CO2: 27 mmol/L (ref 22–32)
Creatinine, Ser: 1.16 mg/dL (ref 0.61–1.24)
GFR calc non Af Amer: >60 mL/min (ref >60)
GLUCOSE: 106 mg/dL — AB (ref 65–99)
POTASSIUM: 3.5 mmol/L (ref 3.5–5.1)
SODIUM: 140 mmol/L (ref 135–145)
Total Bilirubin: 0.8 mg/dL (ref 0.3–1.2)
Total Protein: 6.8 g/dL (ref 6.5–8.1)

## 2015-11-07 LAB — URINALYSIS, ROUTINE W REFLEX MICROSCOPIC
BILIRUBIN URINE: NEGATIVE
GLUCOSE, UA: NEGATIVE mg/dL
Hgb urine dipstick: NEGATIVE
Ketones, ur: NEGATIVE mg/dL
Leukocytes, UA: NEGATIVE
NITRITE: NEGATIVE
PH: 6 (ref 5.0–8.0)
Protein, ur: NEGATIVE mg/dL
SPECIFIC GRAVITY, URINE: 1.01 (ref 1.005–1.030)

## 2015-11-07 LAB — ETHANOL: Alcohol, Ethyl (B): <5 mg/dL (ref <5)

## 2015-11-07 LAB — PROTIME-INR
INR: 1.03
Prothrombin Time: 13.5 seconds (ref 11.4–15.2)

## 2015-11-07 LAB — APTT: aPTT: 37 seconds — ABNORMAL HIGH (ref 24–36)

## 2015-11-07 LAB — I-STAT TROPONIN, ED: Troponin i, poc: 0 ng/mL (ref 0.00–0.08)

## 2015-11-07 MED ORDER — ASPIRIN 325 MG PO TABS: 325.0000 mg | ORAL_TABLET | Freq: Every day | ORAL | Status: AC

## 2015-11-07 MED ORDER — ASPIRIN 81 MG PO CHEW
324.0000 mg | CHEWABLE_TABLET | Freq: Once | ORAL | Status: AC
  Administered 2015-11-07: 324 mg via ORAL
  Filled 2015-11-07: qty 4

## 2015-11-07 NOTE — ED Notes (Signed)
Spoke with Marjory Lies from MRI.  Discussed with patient the risk of becoming claustrophobic.  Pt verbalized understanding of risk and states he has had one before and had no issues. Denies need for something for anxiety.

## 2016-02-26 ENCOUNTER — Encounter (HOSPITAL_COMMUNITY): Payer: Self-pay | Admitting: *Deleted

## 2016-02-26 DIAGNOSIS — R200 Anesthesia of skin: Secondary | ICD-10-CM | POA: Diagnosis present

## 2016-02-26 DIAGNOSIS — Z8673 Personal history of transient ischemic attack (TIA), and cerebral infarction without residual deficits: Secondary | ICD-10-CM | POA: Insufficient documentation

## 2016-02-26 DIAGNOSIS — Z5181 Encounter for therapeutic drug level monitoring: Secondary | ICD-10-CM | POA: Diagnosis not present

## 2016-02-26 DIAGNOSIS — M542 Cervicalgia: Secondary | ICD-10-CM | POA: Insufficient documentation

## 2016-02-26 DIAGNOSIS — F17210 Nicotine dependence, cigarettes, uncomplicated: Secondary | ICD-10-CM | POA: Insufficient documentation

## 2016-02-26 DIAGNOSIS — I10 Essential (primary) hypertension: Secondary | ICD-10-CM | POA: Diagnosis not present

## 2016-02-26 DIAGNOSIS — Z7982 Long term (current) use of aspirin: Secondary | ICD-10-CM | POA: Insufficient documentation

## 2016-02-26 DIAGNOSIS — Z79899 Other long term (current) drug therapy: Secondary | ICD-10-CM | POA: Insufficient documentation

## 2016-02-26 LAB — I-STAT CHEM 8, ED
BUN: 9 mg/dL (ref 6–20)
CALCIUM ION: 1.23 mmol/L (ref 1.15–1.40)
CREATININE: 1.1 mg/dL (ref 0.61–1.24)
Chloride: 101 mmol/L (ref 101–111)
GLUCOSE: 108 mg/dL — AB (ref 65–99)
HCT: 38 % — ABNORMAL LOW (ref 39.0–52.0)
HEMOGLOBIN: 12.9 g/dL — AB (ref 13.0–17.0)
POTASSIUM: 3.9 mmol/L (ref 3.5–5.1)
Sodium: 140 mmol/L (ref 135–145)
TCO2: 27 mmol/L (ref 0–100)

## 2016-02-26 LAB — COMPREHENSIVE METABOLIC PANEL
ALK PHOS: 52 U/L (ref 38–126)
ALT: 30 U/L (ref 17–63)
AST: 31 U/L (ref 15–41)
Albumin: 4.2 g/dL (ref 3.5–5.0)
Anion gap: 8 (ref 5–15)
BILIRUBIN TOTAL: 0.7 mg/dL (ref 0.3–1.2)
BUN: 7 mg/dL (ref 6–20)
CALCIUM: 9.7 mg/dL (ref 8.9–10.3)
CHLORIDE: 104 mmol/L (ref 101–111)
CO2: 27 mmol/L (ref 22–32)
CREATININE: 1.2 mg/dL (ref 0.61–1.24)
Glucose, Bld: 113 mg/dL — ABNORMAL HIGH (ref 65–99)
Potassium: 4.1 mmol/L (ref 3.5–5.1)
Sodium: 139 mmol/L (ref 135–145)
TOTAL PROTEIN: 6.8 g/dL (ref 6.5–8.1)

## 2016-02-26 LAB — DIFFERENTIAL
BASOS ABS: 0.1 10*3/uL (ref 0.0–0.1)
Basophils Relative: 1 %
Eosinophils Absolute: 0.3 10*3/uL (ref 0.0–0.7)
Eosinophils Relative: 5 %
LYMPHS ABS: 2 10*3/uL (ref 0.7–4.0)
Lymphocytes Relative: 34 %
MONO ABS: 0.6 10*3/uL (ref 0.1–1.0)
MONOS PCT: 10 %
NEUTROS ABS: 3 10*3/uL (ref 1.7–7.7)
Neutrophils Relative %: 50 %

## 2016-02-26 LAB — CBC
HEMATOCRIT: 40.4 % (ref 39.0–52.0)
HEMOGLOBIN: 14.1 g/dL (ref 13.0–17.0)
MCH: 30.7 pg (ref 26.0–34.0)
MCHC: 34.9 g/dL (ref 30.0–36.0)
MCV: 87.8 fL (ref 78.0–100.0)
Platelets: 336 10*3/uL (ref 150–400)
RBC: 4.6 MIL/uL (ref 4.22–5.81)
RDW: 13.1 % (ref 11.5–15.5)
WBC: 5.9 10*3/uL (ref 4.0–10.5)

## 2016-02-26 LAB — APTT: APTT: 38 s — AB (ref 24–36)

## 2016-02-26 LAB — I-STAT TROPONIN, ED
TROPONIN I, POC: 0 ng/mL (ref 0.00–0.08)
Troponin i, poc: 0 ng/mL (ref 0.00–0.08)

## 2016-02-26 LAB — PROTIME-INR
INR: 1.01
Prothrombin Time: 13.3 seconds (ref 11.4–15.2)

## 2016-02-26 NOTE — ED Triage Notes (Signed)
Pt was stepping into the shower tonight and felt a sharp pain in his neck with numbness radiating into L arm. Numbness onset at 1800. No neuro symptoms noted on exam.

## 2016-02-27 ENCOUNTER — Emergency Department (HOSPITAL_COMMUNITY)
Admission: EM | Admit: 2016-02-27 | Discharge: 2016-02-27 | Disposition: A | Discharge status: Home / Self Care | Payer: Medicaid Other | Attending: Emergency Medicine | Admitting: Emergency Medicine

## 2016-02-27 ENCOUNTER — Emergency Department (HOSPITAL_COMMUNITY): Payer: Medicaid Other

## 2016-02-27 ENCOUNTER — Other Ambulatory Visit (HOSPITAL_BASED_OUTPATIENT_CLINIC_OR_DEPARTMENT_OTHER): Payer: Self-pay

## 2016-02-27 DIAGNOSIS — M542 Cervicalgia: Secondary | ICD-10-CM

## 2016-02-27 DIAGNOSIS — R5382 Chronic fatigue, unspecified: Secondary | ICD-10-CM

## 2016-02-27 DIAGNOSIS — R200 Anesthesia of skin: Secondary | ICD-10-CM

## 2016-02-27 LAB — CBG MONITORING, ED: Glucose-Capillary: 110 mg/dL — ABNORMAL HIGH (ref 65–99)

## 2016-02-27 MED ORDER — METOCLOPRAMIDE HCL 5 MG/ML IJ SOLN
10.0000 mg | Freq: Once | INTRAMUSCULAR | Status: AC
  Administered 2016-02-27: 10 mg via INTRAVENOUS
  Filled 2016-02-27: qty 2

## 2016-02-27 MED ORDER — DIPHENHYDRAMINE HCL 50 MG/ML IJ SOLN
50.0000 mg | Freq: Once | INTRAMUSCULAR | Status: AC
  Administered 2016-02-27: 50 mg via INTRAVENOUS
  Filled 2016-02-27: qty 1

## 2016-02-27 MED ORDER — IOPAMIDOL (ISOVUE-370) INJECTION 76%
INTRAVENOUS | Status: AC
  Administered 2016-02-27: 50 mL
  Filled 2016-02-27: qty 50

## 2016-02-27 MED ORDER — BENZTROPINE MESYLATE 1 MG/ML IJ SOLN
1.0000 mg | Freq: Once | INTRAMUSCULAR | Status: AC
  Administered 2016-02-27: 1 mg via INTRAVENOUS
  Filled 2016-02-27: qty 2

## 2016-02-27 MED ORDER — KETOROLAC TROMETHAMINE 30 MG/ML IJ SOLN
30.0000 mg | Freq: Once | INTRAMUSCULAR | Status: AC
  Administered 2016-02-27: 30 mg via INTRAVENOUS
  Filled 2016-02-27: qty 1

## 2016-02-27 NOTE — ED Provider Notes (Signed)
[facility] DEPT Provider Note   CSN: [id number] Arrival date & time: 02/26/16  2223  By signing my name below, I, Hansel Feinstein, attest that this documentation has been prepared under the direction and in the presence of Everlene Balls, MD. Electronically Signed: Hansel Feinstein, ED Scribe. 02/27/16. 2:16 AM.     History   Chief Complaint Chief Complaint  Patient presents with  . Numbness    HPI Thomas Willis is a 50 y.o. male with h/o L4, L5 herniated disc, TIA, Lupus who presents to the Emergency Department complaining of moderate left-sided neck pain with shooting pain up the posterior head that began at 6 PM last night. Pt states associated pain and numbness to the LUE. Per pt, he stepped into the shower when his pain suddenly began. Pt denies recent falls, trauma or injury.  Pt reports similar symptoms with pinched nerve in his neck. No worsening or alleviating factors noted. No h/o neck surgery. He denies weakness, pain or numbness to BLE.    The history is provided by the patient. No language interpreter was used.    Past Medical History:  Diagnosis Date  . Arthritis   . Back pain, chronic   . Blood disorder    LPC, treated at [facility]  . Hypertension   . Lumbar herniated disc    L4, L5  . Lupus   . Pancreatitis   . TIA (transient ischemic attack) 2016    Patient Active Problem List   Diagnosis Date Noted  . TIA (transient ischemic attack)   . CVA (cerebral infarction) 12/02/2014  . Left-sided weakness 11/30/2014  . Essential hypertension, benign 11/30/2014  . Tobacco abuse 11/30/2014  . Mixed connective tissue disease ([place]) 11/30/2014  . LEFT VENTRICULAR FUNCTION, DECREASED 01/01/2010  . OTH NONSPECIFIC ABNORM CV SYSTEM FUNCTION STUDY 01/01/2010    Past Surgical History:  Procedure Laterality Date  . COLONOSCOPY WITH PROPOFOL N/A 09/12/2014   Procedure: COLONOSCOPY WITH PROPOFOL;  Surgeon: Hulen Luster, MD;  Location: [facility] ENDOSCOPY;  Service: Gastroenterology;   Laterality: N/A;  . ESOPHAGOGASTRODUODENOSCOPY N/A 09/12/2014   Procedure: ESOPHAGOGASTRODUODENOSCOPY (EGD);  Surgeon: Hulen Luster, MD;  Location: [facility] ENDOSCOPY;  Service: Gastroenterology;  Laterality: N/A;  . HERNIA REPAIR    . LUMBAR EPIDURAL INJECTION         Home Medications    Prior to Admission medications   Medication Sig Start Date End Date Taking? Authorizing Provider  amLODipine (NORVASC) 5 MG tablet Take 5 mg by mouth at bedtime. 01/22/16  Yes Historical Provider, MD  aspirin 325 MG tablet Take 1 tablet (325 mg total) by mouth daily. 11/07/15  Yes John Molpus, MD  hydroxychloroquine (PLAQUENIL) 200 MG tablet Take 1 tablet by mouth 2 (two) times daily. 10/30/15  Yes Historical Provider, MD  leucovorin (WELLCOVORIN) 5 MG tablet Take 1 tablet by mouth every 12 (twelve) hours. 10/07/15  Yes Historical Provider, MD  methotrexate 50 MG/2ML injection Inject 25 mg into the muscle every 7 (seven) days. 01/22/16  Yes Historical Provider, MD    Family History Family History  Problem Relation Age of Onset  . Hypertension    . CAD      Social History Social History  Substance Use Topics  . Smoking status: Light Tobacco Smoker    Types: Cigarettes  . Smokeless tobacco: Never Used  . Alcohol use No     Allergies   Ketoconazole   Review of Systems Review of Systems A complete 10 system review of systems was  obtained and all systems are negative except as noted in the HPI and PMH.    Physical Exam Updated Vital Signs BP 137/90   Pulse (!) 58   Temp 98.3 F (36.8 C) (Oral)   Resp 14   Ht 5\' 10"  (1.778 m)   Wt 194 lb 4 oz (88.1 kg)   SpO2 98%   BMI 27.87 kg/m   Physical Exam  Constitutional: He is oriented to person, place, and time. Vital signs are normal. He appears well-developed and well-nourished.  Non-toxic appearance. He does not appear ill. No distress.  HENT:  Head: Normocephalic and atraumatic.  Nose: Nose normal.  Mouth/Throat: Oropharynx is clear and  moist. No oropharyngeal exudate.  Eyes: Conjunctivae and EOM are normal. Pupils are equal, round, and reactive to light. No scleral icterus.  Neck: Normal range of motion. Neck supple. No tracheal deviation, no edema, no erythema and normal range of motion present. No thyroid mass and no thyromegaly present.  Cardiovascular: Regular rhythm, S1 normal, S2 normal, normal heart sounds, intact distal pulses and normal pulses.  Bradycardia present.  Exam reveals no gallop and no friction rub.   No murmur heard. Pulmonary/Chest: Effort normal and breath sounds normal. No respiratory distress. He has no wheezes. He has no rhonchi. He has no rales.  Abdominal: Soft. Normal appearance and bowel sounds are normal. He exhibits no distension, no ascites and no mass. There is no hepatosplenomegaly. There is no tenderness. There is no rebound, no guarding and no CVA tenderness.  Musculoskeletal: Normal range of motion. He exhibits no edema or tenderness.  Lymphadenopathy:    He has no cervical adenopathy.  Neurological: He is alert and oriented to person, place, and time. He has normal strength. No cranial nerve deficit or sensory deficit.  Normal strength in all extremities. Decreased sensation in LUE. Normal cerebellar testing.   Skin: Skin is warm, dry and intact. No petechiae and no rash noted. He is not diaphoretic. No erythema. No pallor.  Nursing note and vitals reviewed.    ED Treatments / Results   DIAGNOSTIC STUDIES: Oxygen Saturation is 99% on RA, normal by my interpretation.    COORDINATION OF CARE: 2:12 AM Discussed treatment plan with pt at bedside which includes lab work, CT head and pt agreed to plan.    Labs (all labs ordered are listed, but only abnormal results are displayed) Labs Reviewed  APTT - Abnormal; Notable for the following:       Result Value   aPTT 38 (*)    All other components within normal limits  COMPREHENSIVE METABOLIC PANEL - Abnormal; Notable for the following:     Glucose, Bld 113 (*)    All other components within normal limits  CBG MONITORING, ED - Abnormal; Notable for the following:    Glucose-Capillary 110 (*)    All other components within normal limits  I-STAT CHEM 8, ED - Abnormal; Notable for the following:    Glucose, Bld 108 (*)    Hemoglobin 12.9 (*)    HCT 38.0 (*)    All other components within normal limits  PROTIME-INR  CBC  DIFFERENTIAL  I-STAT TROPOININ, ED  I-STAT TROPOININ, ED    EKG  EKG Interpretation  Date/Time:  Thursday February 26 2016 22:46:05 EST Ventricular Rate:  55 PR Interval:  186 QRS Duration: 98 QT Interval:  406 QTC Calculation: 388 R Axis:   36 Text Interpretation:  Sinus bradycardia Septal infarct , age undetermined Abnormal ECG TWI inf leads  No significant change since last tracing Confirmed by Glynn Octave [phone]) on 02/27/2016 1:44:09 AM       Radiology Ct Angio Head W Or Wo Contrast  Result Date: 02/27/2016 CLINICAL DATA:  Neck pain and left arm numbness. EXAM: CT ANGIOGRAPHY HEAD AND NECK TECHNIQUE: Multidetector CT imaging of the head and neck was performed using the standard protocol during bolus administration of intravenous contrast. Multiplanar CT image reconstructions and MIPs were obtained to evaluate the vascular anatomy. Carotid stenosis measurements (when applicable) are obtained utilizing NASCET criteria, using the distal internal carotid diameter as the denominator. CONTRAST:  50 mL Isovue 370 IV COMPARISON:  None. FINDINGS: CTA NECK FINDINGS Aortic arch: There is no aneurysm or dissection of the visualized ascending aorta or aortic arch. There is a normal 3 vessel branching pattern. The visualized proximal subclavian arteries are normal. Right carotid system: The right common carotid origin is widely patent. There is no common carotid or internal carotid artery dissection or aneurysm. No hemodynamically significant stenosis. Left carotid system: The left common carotid  origin is widely patent. There is no common carotid or internal carotid artery dissection or aneurysm. No hemodynamically significant stenosis. Vertebral arteries: Both vertebral artery origins are normal. The vertebral system is left dominant. Both vertebral arteries are normal to their confluence with the basilar artery. Skeleton: There is no bony spinal canal stenosis. No lytic or blastic lesions. Other neck: The nasopharynx is clear. The oropharynx and hypopharynx are normal. The epiglottis is normal. The supraglottic larynx, glottis and subglottic larynx are normal. No retropharyngeal collection. The parapharyngeal spaces are preserved. The parotid and submandibular glands are normal. No sialolithiasis or salivary ductal dilatation. The thyroid gland is normal. There is no cervical lymphadenopathy. Upper chest: No pneumothorax or pleural effusion. No nodules or masses. Review of the MIP images confirms the above findings CTA HEAD FINDINGS Anterior circulation: --Intracranial internal carotid arteries: Normal. --Anterior cerebral arteries: Normal. --Middle cerebral arteries: Normal. --Posterior communicating arteries: Suspected small left P-comm. Posterior circulation: --Posterior cerebral arteries: Normal. --Superior cerebellar arteries: Normal. --Basilar artery: Normal. --Anterior inferior cerebellar arteries: Normal. --Posterior inferior cerebellar arteries: Normal. Venous sinuses: As permitted by contrast timing, patent. Anatomic variants: None Delayed phase: No parenchymal contrast enhancement. Review of the MIP images confirms the above findings IMPRESSION: Normal CTA of the head and neck. Electronically Signed   By: Ulyses Jarred M.D.   On: 02/27/2016 03:45   Ct Head Wo Contrast  Result Date: 02/27/2016 CLINICAL DATA:  Headache and left-sided numbness EXAM: CT HEAD WITHOUT CONTRAST TECHNIQUE: Contiguous axial images were obtained from the base of the skull through the vertex without intravenous  contrast. COMPARISON:  Brain MRI 11/07/2015 FINDINGS: Brain: No mass lesion, intraparenchymal hemorrhage or extra-axial collection. No evidence of acute cortical infarct. Brain parenchyma and CSF-containing spaces are normal for age. Vascular: No hyperdense vessel or unexpected calcification. Skull: Normal visualized skull base, calvarium and extracranial soft tissues. Sinuses/Orbits: No sinus fluid levels or advanced mucosal thickening. No mastoid effusion. Normal orbits. IMPRESSION: Normal head CT. Electronically Signed   By: Ulyses Jarred M.D.   On: 02/27/2016 01:31   Ct Angio Neck W Or Wo Contrast  Result Date: 02/27/2016 CLINICAL DATA:  Neck pain and left arm numbness. EXAM: CT ANGIOGRAPHY HEAD AND NECK TECHNIQUE: Multidetector CT imaging of the head and neck was performed using the standard protocol during bolus administration of intravenous contrast. Multiplanar CT image reconstructions and MIPs were obtained to evaluate the vascular anatomy. Carotid stenosis measurements (when applicable)  are obtained utilizing NASCET criteria, using the distal internal carotid diameter as the denominator. CONTRAST:  50 mL Isovue 370 IV COMPARISON:  None. FINDINGS: CTA NECK FINDINGS Aortic arch: There is no aneurysm or dissection of the visualized ascending aorta or aortic arch. There is a normal 3 vessel branching pattern. The visualized proximal subclavian arteries are normal. Right carotid system: The right common carotid origin is widely patent. There is no common carotid or internal carotid artery dissection or aneurysm. No hemodynamically significant stenosis. Left carotid system: The left common carotid origin is widely patent. There is no common carotid or internal carotid artery dissection or aneurysm. No hemodynamically significant stenosis. Vertebral arteries: Both vertebral artery origins are normal. The vertebral system is left dominant. Both vertebral arteries are normal to their confluence with the basilar  artery. Skeleton: There is no bony spinal canal stenosis. No lytic or blastic lesions. Other neck: The nasopharynx is clear. The oropharynx and hypopharynx are normal. The epiglottis is normal. The supraglottic larynx, glottis and subglottic larynx are normal. No retropharyngeal collection. The parapharyngeal spaces are preserved. The parotid and submandibular glands are normal. No sialolithiasis or salivary ductal dilatation. The thyroid gland is normal. There is no cervical lymphadenopathy. Upper chest: No pneumothorax or pleural effusion. No nodules or masses. Review of the MIP images confirms the above findings CTA HEAD FINDINGS Anterior circulation: --Intracranial internal carotid arteries: Normal. --Anterior cerebral arteries: Normal. --Middle cerebral arteries: Normal. --Posterior communicating arteries: Suspected small left P-comm. Posterior circulation: --Posterior cerebral arteries: Normal. --Superior cerebellar arteries: Normal. --Basilar artery: Normal. --Anterior inferior cerebellar arteries: Normal. --Posterior inferior cerebellar arteries: Normal. Venous sinuses: As permitted by contrast timing, patent. Anatomic variants: None Delayed phase: No parenchymal contrast enhancement. Review of the MIP images confirms the above findings IMPRESSION: Normal CTA of the head and neck. Electronically Signed   By: Ulyses Jarred M.D.   On: 02/27/2016 03:45    Procedures Procedures (including critical care time)  Medications Ordered in ED Medications  ketorolac (TORADOL) 30 MG/ML injection 30 mg (30 mg Intravenous Given 02/27/16 0233)  metoCLOPramide (REGLAN) injection 10 mg (10 mg Intravenous Given 02/27/16 0232)  diphenhydrAMINE (BENADRYL) injection 50 mg (50 mg Intravenous Given 02/27/16 0232)  iopamidol (ISOVUE-370) 76 % injection (50 mLs  Contrast Given 02/27/16 0310)  benztropine mesylate (COGENTIN) injection 1 mg (1 mg Intravenous Given 02/27/16 0259)     Initial Impression / Assessment and Plan /  ED Course  I have reviewed the triage vital signs and the nursing notes.  Pertinent labs & imaging results that were available during my care of the patient were reviewed by me and considered in my medical decision making (see chart for details).  Clinical Course    Patient presents to the ED for neck pain and numbness.  This was sudden onset, concerning for possible vascular abnormality.  Will obtain CTA for evaluation.  He states he also has a history of a pinched nerve, this may be the cause as well.  He was given toradol, reglan, and benadryl to help with pain.  Will continue to monitor.   4:13 AM CTA neg for vascular pathology.  Plan to see PCP within 3 days for close follow up.  Return precautions given.  He appears well and in NAD.  Vs remain within his normal limits and he is safe for DC. Final Clinical Impressions(s) / ED Diagnoses   Final diagnoses:  None    New Prescriptions New Prescriptions   No medications on file  I personally performed the services described in this documentation, which was scribed in my presence. The recorded information has been reviewed and is accurate.      Everlene Balls, MD 02/27/16 [phone]

## 2016-04-23 ENCOUNTER — Ambulatory Visit (HOSPITAL_BASED_OUTPATIENT_CLINIC_OR_DEPARTMENT_OTHER): Payer: Medicaid Other | Attending: Family Medicine | Admitting: Internal Medicine

## 2016-04-23 VITALS — Ht 70.0 in | Wt 198.0 lb

## 2016-04-23 DIAGNOSIS — R5382 Chronic fatigue, unspecified: Secondary | ICD-10-CM | POA: Diagnosis not present

## 2016-04-23 DIAGNOSIS — R5383 Other fatigue: Secondary | ICD-10-CM | POA: Diagnosis present

## 2016-04-23 DIAGNOSIS — R0683 Snoring: Secondary | ICD-10-CM | POA: Insufficient documentation

## 2016-05-01 DIAGNOSIS — R0683 Snoring: Secondary | ICD-10-CM | POA: Diagnosis not present

## 2016-05-01 NOTE — Procedures (Signed)
  Patient Name: Thomas Willis, Thomas Willis Date: 04/23/2016 Gender: Male D.O.B: 1965-09-23 Age (years): 50 Referring Provider: Lucianne Lei Height (inches): 70 Interpreting Physician: Baird Lyons MD, ABSM Weight (lbs): 198 RPSGT: Baxter Flattery BMI: 28 MRN: [id number] Neck Size: 16.00 CLINICAL INFORMATION Sleep Study Type: NPSG  Indication for sleep study: Fatigue, Snoring, Witnessed Apneas  Epworth Sleepiness Score: 11  SLEEP STUDY TECHNIQUE As per the AASM Manual for the Scoring of Sleep and Associated Events v2.3 (April 2016) with a hypopnea requiring 4% desaturations.  The channels recorded and monitored were frontal, central and occipital EEG, electrooculogram (EOG), submentalis EMG (chin), nasal and oral airflow, thoracic and abdominal wall motion, anterior tibialis EMG, snore microphone, electrocardiogram, and pulse oximetry.  MEDICATIONS Medications self-administered by patient taken the night of the study : none reported  SLEEP ARCHITECTURE The study was initiated at 11:08:11 PM and ended at 5:12:54 AM.  Sleep onset time was 23.1 minutes and the sleep efficiency was 85.0%. The total sleep time was 310.0 minutes.  Stage REM latency was 93.5 minutes.  The patient spent 12.42% of the night in stage N1 sleep, 64.52% in stage N2 sleep, 0.00% in stage N3 and 23.06% in REM.  Alpha intrusion was absent.  Supine sleep was 22.72%.  RESPIRATORY PARAMETERS The overall apnea/hypopnea index (AHI) was 0.0 per hour. There were 0 total apneas, including 0 obstructive, 0 central and 0 mixed apneas. There were 0 hypopneas and 0 RERAs.  The AHI during Stage REM sleep was 0.0 per hour.  AHI while supine was 0.0 per hour.  The mean oxygen saturation was 97.33%. The minimum SpO2 during sleep was 94.00%.  Moderate snoring was noted during this study.  CARDIAC DATA The 2 lead EKG demonstrated sinus rhythm. The mean heart rate was 53.78 beats per minute. Other EKG findings include:  None.  LEG MOVEMENT DATA The total PLMS were 0 with a resulting PLMS index of 0.00. Associated arousal with leg movement index was 0.0 .  IMPRESSIONS - No significant obstructive sleep apnea occurred during this study (AHI = 0.0/h). - No significant central sleep apnea occurred during this study (CAI = 0.0/h). - The patient had minimal or no oxygen desaturation during the study (Min O2 = 94.00%) - The patient snored with Moderate snoring volume. - No cardiac abnormalities were noted during this study. - Clinically significant periodic limb movements did not occur during sleep. No significant associated arousals.  DIAGNOSIS - Primary Snoring (786.09 [R06.83 ICD-10])  RECOMMENDATIONS - Avoid alcohol, sedatives and other CNS depressants that may worsen sleep apnea and disrupt normal sleep architecture. - Sleep hygiene should be reviewed to assess factors that may improve sleep quality. - Weight management and regular exercise should be initiated or continued if appropriate.  [Electronically signed] 05/01/2016 10:13 AM  Baird Lyons MD, ABSM Diplomate, American Board of Sleep Medicine   NPI: [id number]  [place], American Board of Sleep Medicine  ELECTRONICALLY SIGNED ON:  05/01/2016, 9:52 AM [place] PH: [phone]   FX: [phone] [place]

## 2016-08-22 IMAGING — MR MR MRA HEAD W/O CM
9 of 12 series · 32 of 48 positions shown · non-contrast
Comparison: CT head November 30, 2014 at 4652 hours.

CLINICAL DATA: LEFT-sided weakness and numbness, difficulty
speaking, follow-up code stroke. History of hypertension, lupus.

EXAM:
MRI HEAD WITHOUT CONTRAST
MRA HEAD WITHOUT CONTRAST
TECHNIQUE: Multiplanar, multiecho pulse sequences of the brain and surrounding
structures were obtained without intravenous contrast. Angiographic
images of the head were obtained using MRA technique without
contrast.

[Series 3: T1 · sagittal · 5.0mm · 0.49mm/px · 1 of 25 slices shown]
[im 1/25]
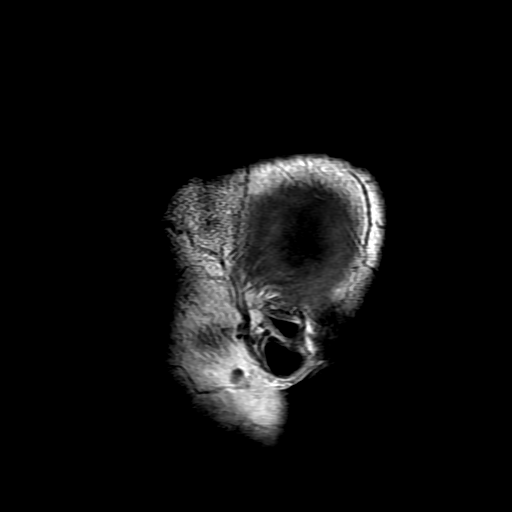

[Series 4: DWI · axial · 3.0mm · 1.09mm/px · z∈[-53,+90]mm · 7 of 98 slices shown (1 of 4)]
[im 1/98]
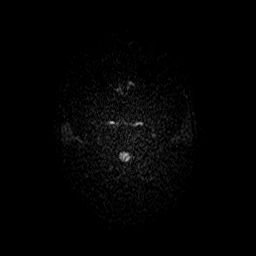
[im 17/98]
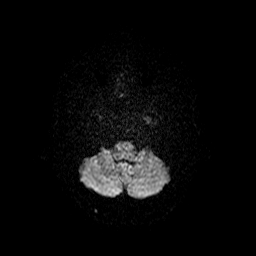
[im 33/98]
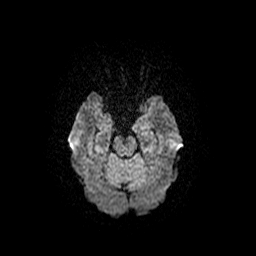
[im 49/98]
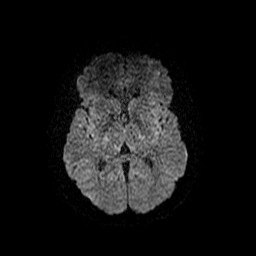
[im 65/98]
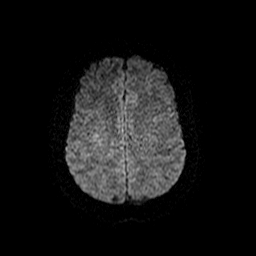
[im 81/98]
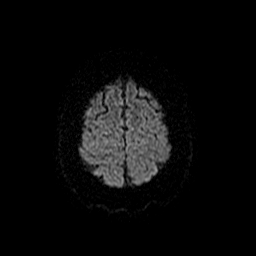
[im 98/98]
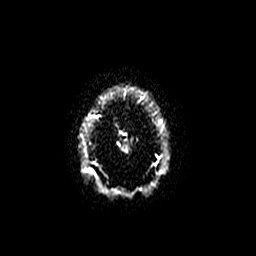

[Series 5: (id) mt fs · axial · 1.4mm · 0.43mm/px · z∈[-66,-0]mm · 6 of 136 slices shown]
[im 1/136]
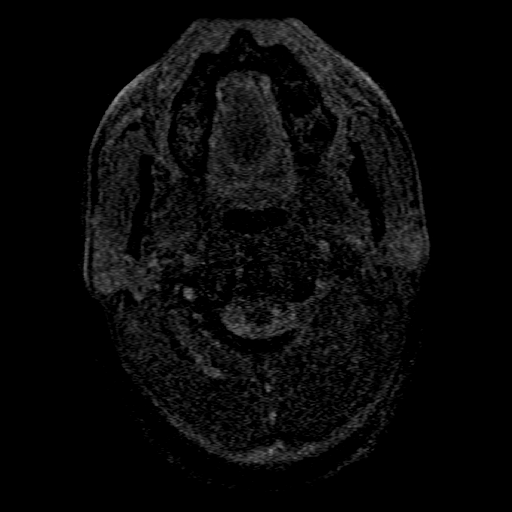
[im 28/136]
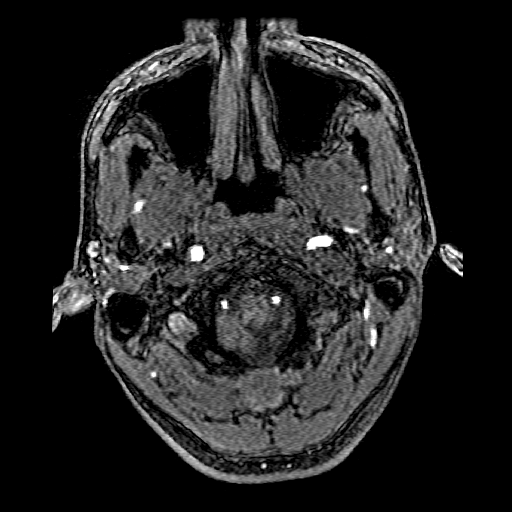
[im 41/136]
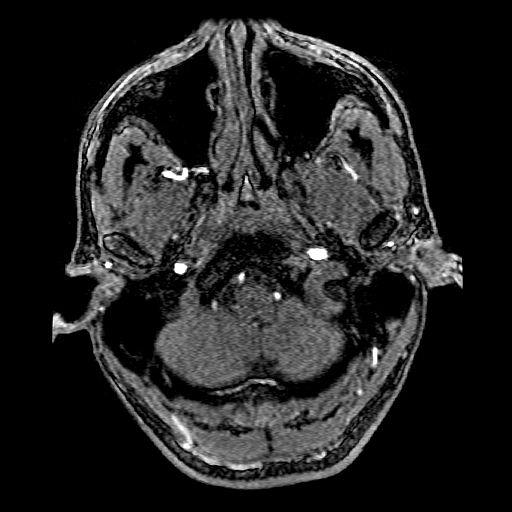
[im 55/136]
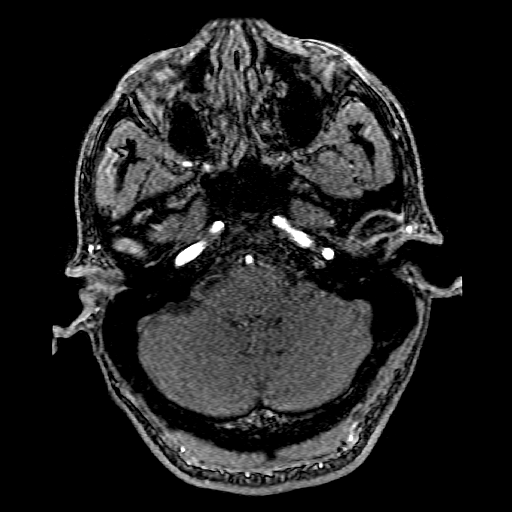
[im 82/136]
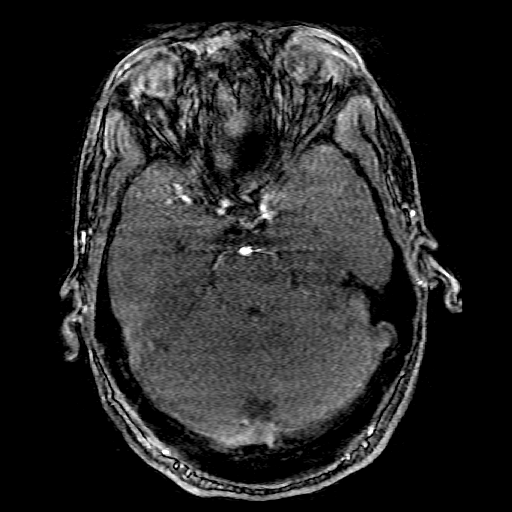
[im 95/136]
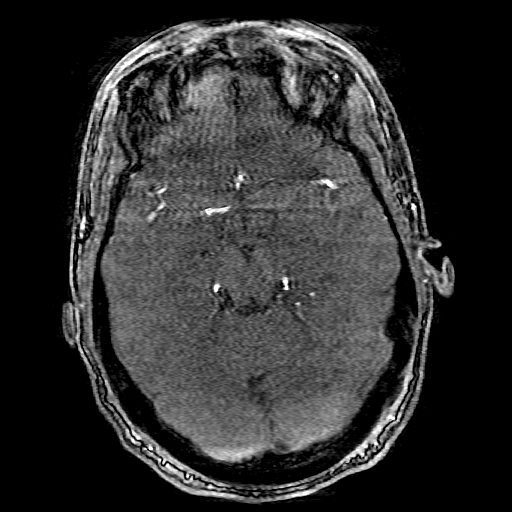

[Series 6: T2 · axial · 5.0mm · 0.45mm/px · z∈[-55,+89]mm · 2 of 25 slices shown (1 of 2)]
[im 1/25]
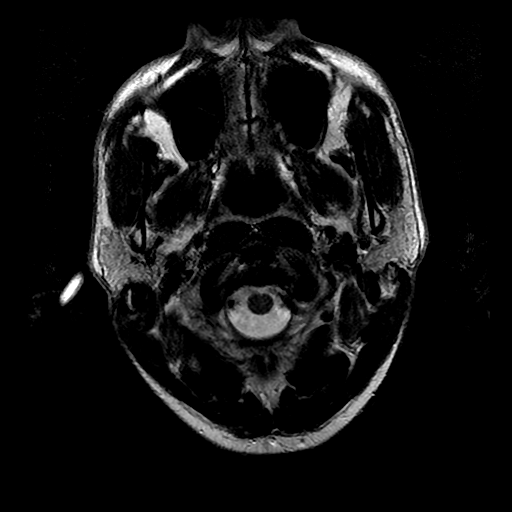
[im 25/25]
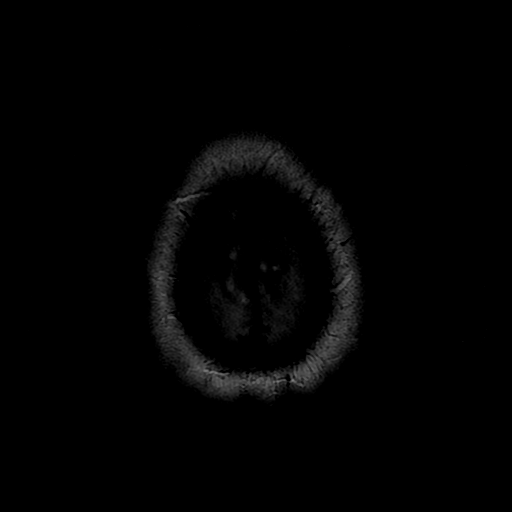

[Series 7: DWI · coronal · 5.0mm · 1.09mm/px · 5 of 70 slices shown (2 of 4)]
[im 1/70]
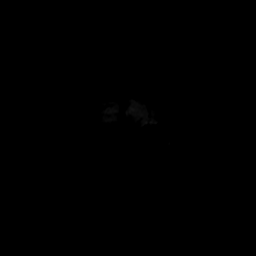
[im 18/70]
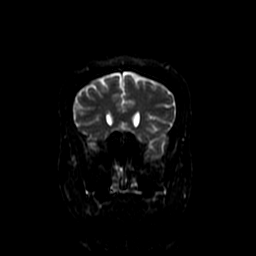
[im 35/70]
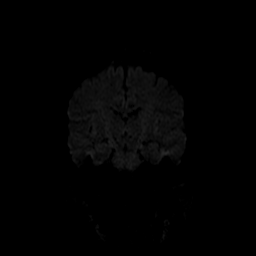
[im 52/70]
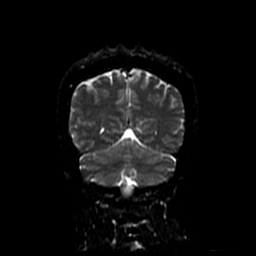
[im 70/70]
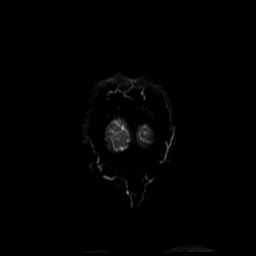

[Series 8: FLAIR · axial · 5.0mm · 0.45mm/px · z∈[-55,+89]mm · 2 of 25 slices shown]
[im 1/25]
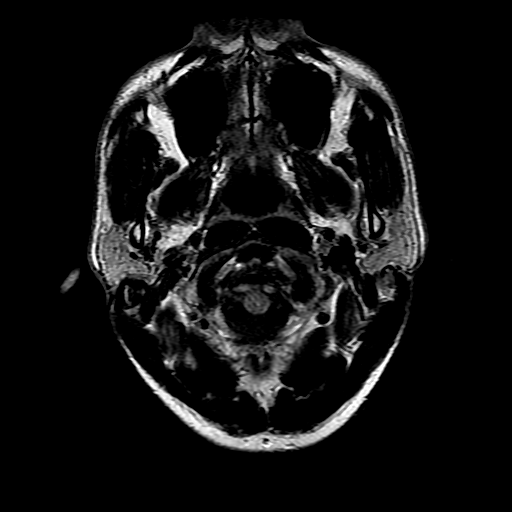
[im 25/25]
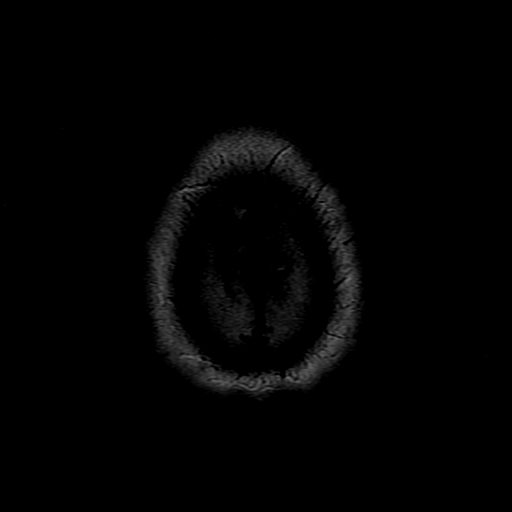

[Series 11: T2 · coronal · 5.0mm · 0.39mm/px · 2 of 27 slices shown (2 of 2)]
[im 1/27]
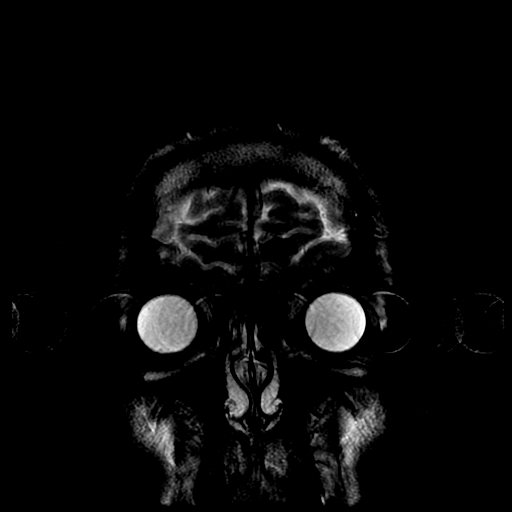
[im 27/27]
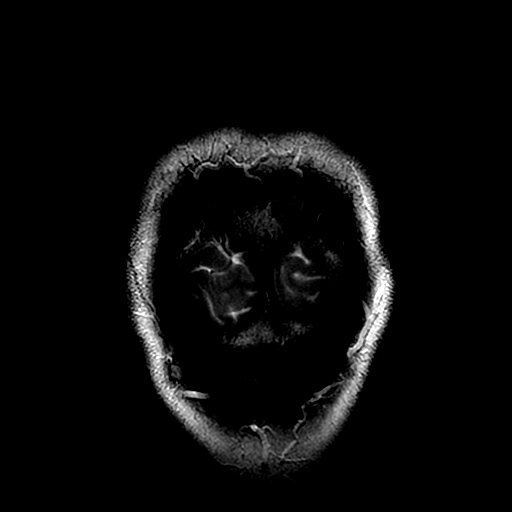

[Series 400: DWI · axial · 3.0mm · 1.09mm/px · z∈[-53,+90]mm · 4 of 49 slices shown (3 of 4)]
[im 1/49]
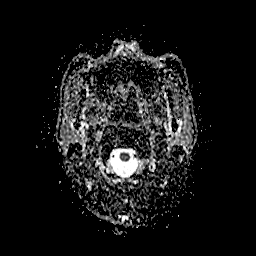
[im 17/49]
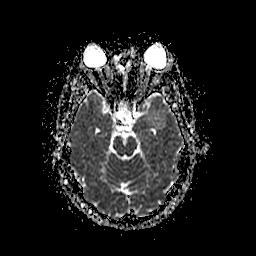
[im 33/49]
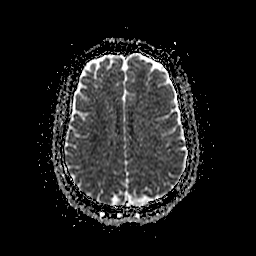
[im 49/49]
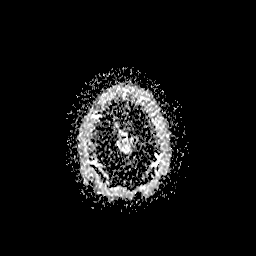

[Series 700: DWI · coronal · 5.0mm · 1.09mm/px · 3 of 35 slices shown (4 of 4)]
[im 1/35]
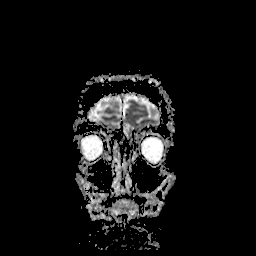
[im 18/35]
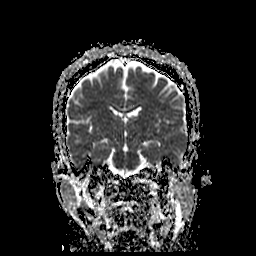
[im 35/35]
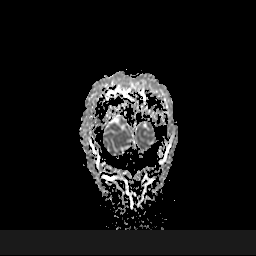

[32 of 48 positions shown; findings below may reference images not displayed]

FINDINGS: MRI HEAD FINDINGS

Mildly motion degraded examination.

The ventricles and sulci are normal for patient's age. No abnormal
parenchymal signal, mass lesions, mass effect. No reduced diffusion
to suggest acute ischemia. No susceptibility artifact to suggest
hemorrhage.

No abnormal extra-axial fluid collections. No extra-axial masses
though, contrast enhanced sequences would be more sensitive. Normal
major intracranial vascular flow voids seen at the skull base. 5 mm
pineal cyst.

Ocular globes and orbital contents are unremarkable though not
tailored for evaluation. No abnormal sellar expansion. Visualized
paranasal sinuses and mastoid air cells are well-aerated. No
suspicious calvarial bone marrow signal. No abnormal sellar
expansion. Craniocervical junction maintained.

MRA HEAD FINDINGS

Moderately motion degraded examination.

The internal carotid arteries appear patent, limited assessment of
the circle of Willis due to motion. Anterior and middle cerebral
arteries demonstrate normal flow related enhancement.

Codominant appearing vertebral arteries with normal flow related
enhancement of basilar artery, posterior cerebral arteries.

No acute large vessel occlusion or definite high-grade stenosis
though motion limits evaluation. Limited assessment for aneurysm.
IMPRESSION: MRI HEAD: Negative noncontrast MRI brain, specifically no acute
ischemia.

MRA HEAD: Moderately motion degraded examination without acute large
vessel occlusion.

## 2016-08-29 ENCOUNTER — Other Ambulatory Visit: Payer: Self-pay | Admitting: Obstetrics and Gynecology

## 2016-12-28 DIAGNOSIS — Z8673 Personal history of transient ischemic attack (TIA), and cerebral infarction without residual deficits: Secondary | ICD-10-CM | POA: Diagnosis not present

## 2016-12-28 DIAGNOSIS — G8929 Other chronic pain: Secondary | ICD-10-CM | POA: Diagnosis not present

## 2016-12-28 DIAGNOSIS — M5010 Cervical disc disorder with radiculopathy, unspecified cervical region: Secondary | ICD-10-CM | POA: Diagnosis not present

## 2016-12-28 DIAGNOSIS — Z23 Encounter for immunization: Secondary | ICD-10-CM | POA: Diagnosis not present

## 2016-12-28 DIAGNOSIS — Z5181 Encounter for therapeutic drug level monitoring: Secondary | ICD-10-CM | POA: Diagnosis not present

## 2016-12-28 DIAGNOSIS — M79641 Pain in right hand: Secondary | ICD-10-CM | POA: Diagnosis not present

## 2016-12-28 DIAGNOSIS — L308 Other specified dermatitis: Secondary | ICD-10-CM | POA: Diagnosis not present

## 2016-12-28 DIAGNOSIS — M2550 Pain in unspecified joint: Secondary | ICD-10-CM | POA: Diagnosis not present

## 2016-12-28 DIAGNOSIS — Z9289 Personal history of other medical treatment: Secondary | ICD-10-CM | POA: Diagnosis not present

## 2016-12-28 DIAGNOSIS — Z883 Allergy status to other anti-infective agents status: Secondary | ICD-10-CM | POA: Diagnosis not present

## 2016-12-28 DIAGNOSIS — Z8261 Family history of arthritis: Secondary | ICD-10-CM | POA: Diagnosis not present

## 2016-12-28 DIAGNOSIS — Z7952 Long term (current) use of systemic steroids: Secondary | ICD-10-CM | POA: Diagnosis not present

## 2016-12-28 DIAGNOSIS — I10 Essential (primary) hypertension: Secondary | ICD-10-CM | POA: Diagnosis not present

## 2016-12-28 DIAGNOSIS — L411 Pityriasis lichenoides chronica: Secondary | ICD-10-CM | POA: Diagnosis not present

## 2016-12-28 DIAGNOSIS — M2560 Stiffness of unspecified joint, not elsewhere classified: Secondary | ICD-10-CM | POA: Diagnosis not present

## 2016-12-28 DIAGNOSIS — M5117 Intervertebral disc disorders with radiculopathy, lumbosacral region: Secondary | ICD-10-CM | POA: Diagnosis not present

## 2016-12-28 DIAGNOSIS — Z79891 Long term (current) use of opiate analgesic: Secondary | ICD-10-CM | POA: Diagnosis not present

## 2016-12-28 DIAGNOSIS — M2140 Flat foot [pes planus] (acquired), unspecified foot: Secondary | ICD-10-CM | POA: Diagnosis not present

## 2016-12-28 DIAGNOSIS — Z79899 Other long term (current) drug therapy: Secondary | ICD-10-CM | POA: Diagnosis not present

## 2016-12-28 DIAGNOSIS — M359 Systemic involvement of connective tissue, unspecified: Secondary | ICD-10-CM | POA: Diagnosis not present

## 2016-12-28 DIAGNOSIS — M79642 Pain in left hand: Secondary | ICD-10-CM | POA: Diagnosis not present

## 2017-02-28 DIAGNOSIS — M359 Systemic involvement of connective tissue, unspecified: Secondary | ICD-10-CM | POA: Diagnosis not present

## 2017-02-28 DIAGNOSIS — L309 Dermatitis, unspecified: Secondary | ICD-10-CM | POA: Diagnosis not present

## 2017-02-28 DIAGNOSIS — R8290 Unspecified abnormal findings in urine: Secondary | ICD-10-CM | POA: Diagnosis not present

## 2017-02-28 DIAGNOSIS — Z79899 Other long term (current) drug therapy: Secondary | ICD-10-CM | POA: Diagnosis not present

## 2017-04-01 DIAGNOSIS — M545 Low back pain: Secondary | ICD-10-CM | POA: Diagnosis not present

## 2017-04-01 DIAGNOSIS — M329 Systemic lupus erythematosus, unspecified: Secondary | ICD-10-CM | POA: Diagnosis not present

## 2017-04-01 DIAGNOSIS — I10 Essential (primary) hypertension: Secondary | ICD-10-CM | POA: Diagnosis not present

## 2017-04-04 DIAGNOSIS — Z79899 Other long term (current) drug therapy: Secondary | ICD-10-CM | POA: Diagnosis not present

## 2017-05-05 DIAGNOSIS — M479 Spondylosis, unspecified: Secondary | ICD-10-CM | POA: Diagnosis not present

## 2017-05-05 DIAGNOSIS — M359 Systemic involvement of connective tissue, unspecified: Secondary | ICD-10-CM | POA: Diagnosis not present

## 2017-05-05 DIAGNOSIS — Z8673 Personal history of transient ischemic attack (TIA), and cerebral infarction without residual deficits: Secondary | ICD-10-CM | POA: Diagnosis not present

## 2017-05-05 DIAGNOSIS — Z79899 Other long term (current) drug therapy: Secondary | ICD-10-CM | POA: Diagnosis not present

## 2017-05-11 DIAGNOSIS — Z79899 Other long term (current) drug therapy: Secondary | ICD-10-CM | POA: Diagnosis not present

## 2017-05-24 DIAGNOSIS — M545 Low back pain: Secondary | ICD-10-CM | POA: Diagnosis not present

## 2017-08-08 ENCOUNTER — Other Ambulatory Visit: Payer: Self-pay

## 2017-08-08 NOTE — Patient Outreach (Signed)
[facility]) Care Management  08/08/2017  Thomas Willis Mar 03, 1966 [id number]   Medication Adherence call to Thomas Willis pateint is showing past due under [facility] Ins.on Lisinopril/HCTZ 20/25 mg patients telephone number is disconnected under [facility] and [place] Management Direct Dial [phone]  Fax [phone] [email or web address]

## 2017-12-07 DIAGNOSIS — M2550 Pain in unspecified joint: Secondary | ICD-10-CM | POA: Diagnosis not present

## 2017-12-07 DIAGNOSIS — Z79899 Other long term (current) drug therapy: Secondary | ICD-10-CM | POA: Diagnosis not present

## 2017-12-07 DIAGNOSIS — M5431 Sciatica, right side: Secondary | ICD-10-CM | POA: Diagnosis not present

## 2017-12-07 DIAGNOSIS — M351 Other overlap syndromes: Secondary | ICD-10-CM | POA: Diagnosis not present

## 2017-12-07 DIAGNOSIS — I10 Essential (primary) hypertension: Secondary | ICD-10-CM | POA: Diagnosis not present

## 2017-12-07 DIAGNOSIS — L411 Pityriasis lichenoides chronica: Secondary | ICD-10-CM | POA: Diagnosis not present

## 2017-12-07 DIAGNOSIS — Z23 Encounter for immunization: Secondary | ICD-10-CM | POA: Diagnosis not present

## 2017-12-07 DIAGNOSIS — M5010 Cervical disc disorder with radiculopathy, unspecified cervical region: Secondary | ICD-10-CM | POA: Diagnosis not present

## 2017-12-07 DIAGNOSIS — M359 Systemic involvement of connective tissue, unspecified: Secondary | ICD-10-CM | POA: Diagnosis not present

## 2017-12-07 DIAGNOSIS — Z8673 Personal history of transient ischemic attack (TIA), and cerebral infarction without residual deficits: Secondary | ICD-10-CM | POA: Diagnosis not present

## 2018-03-06 DIAGNOSIS — L309 Dermatitis, unspecified: Secondary | ICD-10-CM | POA: Diagnosis not present

## 2018-03-06 DIAGNOSIS — Z79899 Other long term (current) drug therapy: Secondary | ICD-10-CM | POA: Diagnosis not present

## 2018-03-06 DIAGNOSIS — M359 Systemic involvement of connective tissue, unspecified: Secondary | ICD-10-CM | POA: Diagnosis not present

## 2018-09-25 DIAGNOSIS — Z872 Personal history of diseases of the skin and subcutaneous tissue: Secondary | ICD-10-CM | POA: Diagnosis not present

## 2018-09-25 DIAGNOSIS — I10 Essential (primary) hypertension: Secondary | ICD-10-CM | POA: Diagnosis not present

## 2018-09-25 DIAGNOSIS — R6883 Chills (without fever): Secondary | ICD-10-CM | POA: Diagnosis not present

## 2018-09-25 DIAGNOSIS — M329 Systemic lupus erythematosus, unspecified: Secondary | ICD-10-CM | POA: Diagnosis not present

## 2018-09-25 DIAGNOSIS — Z79899 Other long term (current) drug therapy: Secondary | ICD-10-CM | POA: Diagnosis not present

## 2018-09-25 DIAGNOSIS — M546 Pain in thoracic spine: Secondary | ICD-10-CM | POA: Diagnosis not present

## 2018-09-25 DIAGNOSIS — Z8249 Family history of ischemic heart disease and other diseases of the circulatory system: Secondary | ICD-10-CM | POA: Diagnosis not present

## 2018-09-25 DIAGNOSIS — G44209 Tension-type headache, unspecified, not intractable: Secondary | ICD-10-CM | POA: Diagnosis not present

## 2018-09-25 DIAGNOSIS — Z8673 Personal history of transient ischemic attack (TIA), and cerebral infarction without residual deficits: Secondary | ICD-10-CM | POA: Diagnosis not present

## 2018-09-25 DIAGNOSIS — R110 Nausea: Secondary | ICD-10-CM | POA: Diagnosis not present

## 2018-10-24 DIAGNOSIS — M329 Systemic lupus erythematosus, unspecified: Secondary | ICD-10-CM | POA: Diagnosis not present

## 2018-10-24 DIAGNOSIS — I10 Essential (primary) hypertension: Secondary | ICD-10-CM | POA: Diagnosis not present

## 2018-11-08 ENCOUNTER — Other Ambulatory Visit: Payer: Self-pay

## 2018-11-08 ENCOUNTER — Ambulatory Visit
Admission: RE | Admit: 2018-11-08 | Discharge: 2018-11-08 | Disposition: A | Discharge status: Home / Self Care | Payer: Medicare Other | Source: Ambulatory Visit | Attending: Family Medicine | Admitting: Family Medicine

## 2018-11-08 ENCOUNTER — Other Ambulatory Visit: Payer: Self-pay | Admitting: Family Medicine

## 2018-11-08 DIAGNOSIS — S63659A Sprain of metacarpophalangeal joint of unspecified finger, initial encounter: Secondary | ICD-10-CM | POA: Diagnosis not present

## 2018-11-08 DIAGNOSIS — S63001A Unspecified subluxation of right wrist and hand, initial encounter: Secondary | ICD-10-CM

## 2018-11-08 DIAGNOSIS — M79641 Pain in right hand: Secondary | ICD-10-CM | POA: Diagnosis not present

## 2018-12-11 DIAGNOSIS — M79642 Pain in left hand: Secondary | ICD-10-CM | POA: Diagnosis not present

## 2018-12-11 DIAGNOSIS — S63659D Sprain of metacarpophalangeal joint of unspecified finger, subsequent encounter: Secondary | ICD-10-CM | POA: Diagnosis not present

## 2018-12-11 DIAGNOSIS — S63659A Sprain of metacarpophalangeal joint of unspecified finger, initial encounter: Secondary | ICD-10-CM | POA: Diagnosis not present

## 2019-02-16 DIAGNOSIS — I10 Essential (primary) hypertension: Secondary | ICD-10-CM | POA: Diagnosis not present

## 2019-05-01 DIAGNOSIS — M359 Systemic involvement of connective tissue, unspecified: Secondary | ICD-10-CM | POA: Diagnosis not present

## 2019-07-20 DIAGNOSIS — K0261 Dental caries on smooth surface limited to enamel: Secondary | ICD-10-CM | POA: Diagnosis not present

## 2019-07-20 DIAGNOSIS — G2581 Restless legs syndrome: Secondary | ICD-10-CM | POA: Diagnosis not present

## 2019-07-20 DIAGNOSIS — I10 Essential (primary) hypertension: Secondary | ICD-10-CM | POA: Diagnosis not present

## 2019-07-20 DIAGNOSIS — M329 Systemic lupus erythematosus, unspecified: Secondary | ICD-10-CM | POA: Diagnosis not present

## 2019-10-19 DIAGNOSIS — M545 Low back pain: Secondary | ICD-10-CM | POA: Diagnosis not present

## 2019-10-19 DIAGNOSIS — L408 Other psoriasis: Secondary | ICD-10-CM | POA: Diagnosis not present

## 2019-10-19 DIAGNOSIS — M329 Systemic lupus erythematosus, unspecified: Secondary | ICD-10-CM | POA: Diagnosis not present

## 2019-10-19 DIAGNOSIS — I10 Essential (primary) hypertension: Secondary | ICD-10-CM | POA: Diagnosis not present

## 2019-10-22 DIAGNOSIS — M359 Systemic involvement of connective tissue, unspecified: Secondary | ICD-10-CM | POA: Diagnosis not present

## 2019-10-22 DIAGNOSIS — L309 Dermatitis, unspecified: Secondary | ICD-10-CM | POA: Diagnosis not present

## 2019-10-22 DIAGNOSIS — L411 Pityriasis lichenoides chronica: Secondary | ICD-10-CM | POA: Diagnosis not present

## 2020-02-12 ENCOUNTER — Encounter (HOSPITAL_COMMUNITY): Payer: Self-pay | Admitting: Emergency Medicine

## 2020-02-12 ENCOUNTER — Emergency Department (HOSPITAL_COMMUNITY): Payer: Medicare Other

## 2020-02-12 ENCOUNTER — Emergency Department (HOSPITAL_COMMUNITY)
Admission: EM | Admit: 2020-02-12 | Discharge: 2020-02-12 | Disposition: A | Discharge status: Home / Self Care | Payer: Medicare Other | Attending: Emergency Medicine | Admitting: Emergency Medicine

## 2020-02-12 ENCOUNTER — Other Ambulatory Visit: Payer: Self-pay

## 2020-02-12 DIAGNOSIS — F17210 Nicotine dependence, cigarettes, uncomplicated: Secondary | ICD-10-CM | POA: Insufficient documentation

## 2020-02-12 DIAGNOSIS — E876 Hypokalemia: Secondary | ICD-10-CM | POA: Insufficient documentation

## 2020-02-12 DIAGNOSIS — G44209 Tension-type headache, unspecified, not intractable: Secondary | ICD-10-CM | POA: Diagnosis not present

## 2020-02-12 DIAGNOSIS — R6889 Other general symptoms and signs: Secondary | ICD-10-CM | POA: Diagnosis not present

## 2020-02-12 DIAGNOSIS — R519 Headache, unspecified: Secondary | ICD-10-CM | POA: Diagnosis not present

## 2020-02-12 DIAGNOSIS — R52 Pain, unspecified: Secondary | ICD-10-CM | POA: Diagnosis not present

## 2020-02-12 DIAGNOSIS — Z20822 Contact with and (suspected) exposure to covid-19: Secondary | ICD-10-CM | POA: Insufficient documentation

## 2020-02-12 DIAGNOSIS — I10 Essential (primary) hypertension: Secondary | ICD-10-CM | POA: Diagnosis not present

## 2020-02-12 DIAGNOSIS — M329 Systemic lupus erythematosus, unspecified: Secondary | ICD-10-CM | POA: Diagnosis not present

## 2020-02-12 DIAGNOSIS — Z79899 Other long term (current) drug therapy: Secondary | ICD-10-CM | POA: Insufficient documentation

## 2020-02-12 DIAGNOSIS — Z7982 Long term (current) use of aspirin: Secondary | ICD-10-CM | POA: Insufficient documentation

## 2020-02-12 DIAGNOSIS — Z743 Need for continuous supervision: Secondary | ICD-10-CM | POA: Diagnosis not present

## 2020-02-12 DIAGNOSIS — G4489 Other headache syndrome: Secondary | ICD-10-CM | POA: Diagnosis not present

## 2020-02-12 DIAGNOSIS — R531 Weakness: Secondary | ICD-10-CM | POA: Diagnosis present

## 2020-02-12 DIAGNOSIS — Z8673 Personal history of transient ischemic attack (TIA), and cerebral infarction without residual deficits: Secondary | ICD-10-CM | POA: Insufficient documentation

## 2020-02-12 LAB — CBC
HCT: 42.9 % (ref 39.0–52.0)
Hemoglobin: 14.7 g/dL (ref 13.0–17.0)
MCH: 31.1 pg (ref 26.0–34.0)
MCHC: 34.3 g/dL (ref 30.0–36.0)
MCV: 90.9 fL (ref 80.0–100.0)
Platelets: 341 10*3/uL (ref 150–400)
RBC: 4.72 MIL/uL (ref 4.22–5.81)
RDW: 13.3 % (ref 11.5–15.5)
WBC: 7.5 10*3/uL (ref 4.0–10.5)
nRBC: 0 % (ref 0.0–0.2)

## 2020-02-12 LAB — RESPIRATORY PANEL BY RT PCR (FLU A&B, COVID)
Influenza A by PCR: NEGATIVE
Influenza B by PCR: NEGATIVE
SARS Coronavirus 2 by RT PCR: NEGATIVE

## 2020-02-12 LAB — COMPREHENSIVE METABOLIC PANEL
ALT: 19 U/L (ref 0–44)
AST: 27 U/L (ref 15–41)
Albumin: 4.5 g/dL (ref 3.5–5.0)
Alkaline Phosphatase: 56 U/L (ref 38–126)
Anion gap: 9 (ref 5–15)
BUN: 10 mg/dL (ref 6–20)
CO2: 27 mmol/L (ref 22–32)
Calcium: 9.3 mg/dL (ref 8.9–10.3)
Chloride: 101 mmol/L (ref 98–111)
Creatinine, Ser: 1.22 mg/dL (ref 0.61–1.24)
GFR, Estimated: >60 mL/min (ref >60)
Glucose, Bld: 116 mg/dL — ABNORMAL HIGH (ref 70–99)
Potassium: 3.2 mmol/L — ABNORMAL LOW (ref 3.5–5.1)
Sodium: 137 mmol/L (ref 135–145)
Total Bilirubin: 0.7 mg/dL (ref 0.3–1.2)
Total Protein: 7.4 g/dL (ref 6.5–8.1)

## 2020-02-12 LAB — DIFFERENTIAL
Abs Immature Granulocytes: 0.01 10*3/uL (ref 0.00–0.07)
Basophils Absolute: 0.1 10*3/uL (ref 0.0–0.1)
Basophils Relative: 1 %
Eosinophils Absolute: 0.1 10*3/uL (ref 0.0–0.5)
Eosinophils Relative: 2 %
Immature Granulocytes: 0 %
Lymphocytes Relative: 22 %
Lymphs Abs: 1.7 10*3/uL (ref 0.7–4.0)
Monocytes Absolute: 0.6 10*3/uL (ref 0.1–1.0)
Monocytes Relative: 8 %
Neutro Abs: 5 10*3/uL (ref 1.7–7.7)
Neutrophils Relative %: 67 %

## 2020-02-12 LAB — APTT: aPTT: 32 seconds (ref 24–36)

## 2020-02-12 LAB — PROTIME-INR
INR: 1 (ref 0.8–1.2)
Prothrombin Time: 12.4 seconds (ref 11.4–15.2)

## 2020-02-12 LAB — ETHANOL: Alcohol, Ethyl (B): <10 mg/dL (ref <10)

## 2020-02-12 MED ORDER — POTASSIUM CHLORIDE CRYS ER 20 MEQ PO TBCR
40.0000 meq | EXTENDED_RELEASE_TABLET | Freq: Once | ORAL | Status: AC
  Administered 2020-02-12: 40 meq via ORAL
  Filled 2020-02-12: qty 2

## 2020-02-12 MED ORDER — METOCLOPRAMIDE HCL 5 MG/ML IJ SOLN
10.0000 mg | Freq: Once | INTRAMUSCULAR | Status: AC
  Administered 2020-02-12: 10 mg via INTRAVENOUS
  Filled 2020-02-12: qty 2

## 2020-02-12 MED ORDER — ASPIRIN 81 MG PO CHEW
324.0000 mg | CHEWABLE_TABLET | Freq: Once | ORAL | Status: AC
  Administered 2020-02-12: 324 mg via ORAL
  Filled 2020-02-12: qty 4

## 2020-02-12 MED ORDER — DIPHENHYDRAMINE HCL 50 MG/ML IJ SOLN
25.0000 mg | Freq: Once | INTRAMUSCULAR | Status: AC
  Administered 2020-02-12: 25 mg via INTRAVENOUS
  Filled 2020-02-12: qty 1

## 2020-02-12 NOTE — Discharge Instructions (Signed)
Follow-up closely with your primary doctor.  Return for strokelike symptoms or new concerns to the emergency room.

## 2020-02-12 NOTE — ED Provider Notes (Signed)
[facility] EMERGENCY DEPARTMENT Provider Note   CSN: [id number] Arrival date & time: 02/12/20  1926  An emergency department physician performed an initial assessment on this suspected stroke patient at 34.  History Chief Complaint  Patient presents with  . Weakness    Thomas Willis is a 54 y.o. male.  Patient with history of stroke without deficits, high blood pressure, states compliance with oral blood pressure medication amlodipine presents with acute onset headache and general weakness, history of stroke.  Patient says it feels similar to the stroke in the past.  Patient feels generally weak, no confusion.  No unilateral signs or symptoms.  Blood pressure in route 217 for EMS.        Past Medical History:  Diagnosis Date  . Arthritis   . Back pain, chronic   . Blood disorder    LPC, treated at [facility]  . Hypertension   . Lumbar herniated disc    L4, L5  . Lupus ([place])   . Pancreatitis   . TIA (transient ischemic attack) 2016    Patient Active Problem List   Diagnosis Date Noted  . TIA (transient ischemic attack)   . CVA (cerebral infarction) 12/02/2014  . Left-sided weakness 11/30/2014  . Essential hypertension, benign 11/30/2014  . Tobacco abuse 11/30/2014  . Mixed connective tissue disease ([place]) 11/30/2014  . LEFT VENTRICULAR FUNCTION, DECREASED 01/01/2010  . OTH NONSPECIFIC ABNORM CV SYSTEM FUNCTION STUDY 01/01/2010    Past Surgical History:  Procedure Laterality Date  . COLONOSCOPY WITH PROPOFOL N/A 09/12/2014   Procedure: COLONOSCOPY WITH PROPOFOL;  Surgeon: Hulen Luster, MD;  Location: [facility] ENDOSCOPY;  Service: Gastroenterology;  Laterality: N/A;  . ESOPHAGOGASTRODUODENOSCOPY N/A 09/12/2014   Procedure: ESOPHAGOGASTRODUODENOSCOPY (EGD);  Surgeon: Hulen Luster, MD;  Location: [facility] ENDOSCOPY;  Service: Gastroenterology;  Laterality: N/A;  . HERNIA REPAIR    . LUMBAR EPIDURAL INJECTION         Family History  Problem Relation Age of Onset  .  Hypertension Other   . CAD Other     Social History   Tobacco Use  . Smoking status: Light Tobacco Smoker    Types: Cigarettes  . Smokeless tobacco: Never Used  Substance Use Topics  . Alcohol use: No  . Drug use: No    Home Medications Prior to Admission medications   Medication Sig Start Date End Date Taking? Authorizing Provider  amLODipine (NORVASC) 5 MG tablet Take 5 mg by mouth at bedtime. 01/22/16  Yes [provider]  hydroxychloroquine (PLAQUENIL) 200 MG tablet Take 1 tablet by mouth 2 (two) times daily. 10/30/15  Yes [provider]  leucovorin (WELLCOVORIN) 5 MG tablet Take 1 tablet by mouth every 12 (twelve) hours. 10/07/15  Yes [provider]  lisinopril (ZESTRIL) 10 MG tablet Take by mouth. 09/17/15  Yes [provider]  methotrexate 50 MG/2ML injection Inject 25 mg into the muscle every 7 (seven) days. 01/22/16  Yes [provider]  aspirin 325 MG tablet Take 1 tablet (325 mg total) by mouth daily. Patient not taking: Reported on 02/12/2020 11/07/15   Molpus, Jenny Reichmann, MD    Allergies    Ketoconazole  Review of Systems   Review of Systems  Constitutional: Negative for chills and fever.  HENT: Negative for congestion.   Eyes: Negative for visual disturbance.  Respiratory: Negative for shortness of breath.   Cardiovascular: Negative for chest pain.  Gastrointestinal: Negative for abdominal pain and vomiting.  Genitourinary: Negative for dysuria and flank  pain.  Musculoskeletal: Negative for back pain, neck pain and neck stiffness.  Skin: Negative for rash.  Neurological: Positive for weakness and headaches. Negative for light-headedness.    Physical Exam Updated Vital Signs BP (!) 163/87   Pulse (!) 50   Temp 97.6 F (36.4 C) (Oral)   Resp 20   SpO2 100%   Physical Exam Vitals and nursing note reviewed.  Constitutional:      Appearance: He is well-developed.  HENT:     Head: Normocephalic and atraumatic.    Eyes:     General:        Right eye: No discharge.        Left eye: No discharge.     Conjunctiva/sclera: Conjunctivae normal.  Neck:     Trachea: No tracheal deviation.  Cardiovascular:     Rate and Rhythm: Normal rate and regular rhythm.  Pulmonary:     Effort: Pulmonary effort is normal.     Breath sounds: Normal breath sounds.  Abdominal:     General: There is no distension.     Palpations: Abdomen is soft.     Tenderness: There is no abdominal tenderness. There is no guarding.  Musculoskeletal:        General: Normal range of motion.     Cervical back: Normal range of motion and neck supple. No rigidity.  Skin:    General: Skin is warm.     Capillary Refill: Capillary refill takes less than 2 seconds.     Findings: No rash.  Neurological:     General: No focal deficit present.     Mental Status: He is alert and oriented to person, place, and time.     Comments: 5+ strength in UE and LE with f/e at major joints. Sensation to palpation intact in UE and LE. CNs 2-12 grossly intact.  EOMFI.  PERRL.   Finger nose and coordination intact bilateral.   Visual fields intact to finger testing. No nystagmus   Psychiatric:        Behavior: Behavior is not agitated or aggressive.     Comments: uncomfortable     ED Results / Procedures / Treatments   Labs (all labs ordered are listed, but only abnormal results are displayed) Labs Reviewed  COMPREHENSIVE METABOLIC PANEL - Abnormal; Notable for the following components:      Result Value   Potassium 3.2 (*)    Glucose, Bld 116 (*)    All other components within normal limits  RESPIRATORY PANEL BY RT PCR (FLU A&B, COVID)  ETHANOL  PROTIME-INR  APTT  CBC  DIFFERENTIAL  RAPID URINE DRUG SCREEN, HOSP PERFORMED  URINALYSIS, ROUTINE W REFLEX MICROSCOPIC    EKG None  Radiology CT HEAD WO CONTRAST  Result Date: 02/12/2020 CLINICAL DATA:  Headache EXAM: CT HEAD WITHOUT CONTRAST TECHNIQUE: Contiguous axial images were  obtained from the base of the skull through the vertex without intravenous contrast. COMPARISON:  02/27/2016 FINDINGS: Brain: No acute intracranial abnormality. Specifically, no hemorrhage, hydrocephalus, mass lesion, acute infarction, or significant intracranial injury. Vascular: No hyperdense vessel or unexpected calcification. Skull: No acute calvarial abnormality. Sinuses/Orbits: No acute findings Other: None IMPRESSION: No acute intracranial abnormality. Electronically Signed   By: Rolm Baptise M.D.   On: 02/12/2020 19:49    Procedures Procedures (including critical care time)  Medications Ordered in ED Medications  aspirin chewable tablet 324 mg (324 mg Oral Given 02/12/20 2011)  metoCLOPramide (REGLAN) injection 10 mg (10 mg Intravenous Given 02/12/20 2012)  diphenhydrAMINE (BENADRYL) injection 25 mg (25 mg Intravenous Given 02/12/20 2012)  potassium chloride SA (KLOR-CON) CR tablet 40 mEq (40 mEq Oral Given 02/12/20 2143)    ED Course  I have reviewed the triage vital signs and the nursing notes.  Pertinent labs & imaging results that were available during my care of the patient were reviewed by me and considered in my medical decision making (see chart for details).    MDM Rules/Calculators/A&P                          Patient presented with EMS as possible code stroke.  Onset of acute headache approximately 1 hour prior to arrival. Patient has no focal neuro deficits, no arm or leg drift at this time, generally feels weak. Patient sent immediately to CT scanner to ensure no bleed, reviewed and no hemorrhage appreciated. Migraine and headache medications given.  Blood work pending.  Aspirin ordered.  Differential diagnosis includes subarachnoid hemorrhage, migraine, other acute headache, TIA, stroke, blood pressure related, other. EKG bradycardia, similar morphologies previous EKG done in the past. Patient symptoms resolved with headache cocktail.  Patient well-appearing on  reassessment and no neurologic signs or symptoms no headache. Discussed observation in the ER for MRI in the morning to look for signs of occult stroke, patient prefers to follow-up closely outpatient.  Strict reasons return given.  Blood pressure improved however still elevated needs close outpatient follow-up. Mild low potassium 3.2 oral potassium given.  Final Clinical Impression(s) / ED Diagnoses Final diagnoses:  Acute non intractable tension-type headache  History of CVA (cerebrovascular accident)  Hypokalemia    Rx / DC Orders ED Discharge Orders    None       Elnora Morrison, MD 02/12/20 2308

## 2020-02-12 NOTE — ED Triage Notes (Signed)
Pt here by [facility] for sudden onset headache and weakness, hx of stroke, per EMS BP en route 217/105, EDP met in hallway, pt sent to CT at [facility]

## 2020-06-25 DIAGNOSIS — Z0000 Encounter for general adult medical examination without abnormal findings: Secondary | ICD-10-CM | POA: Diagnosis not present

## 2020-08-23 ENCOUNTER — Emergency Department (HOSPITAL_COMMUNITY)
Admission: EM | Admit: 2020-08-23 | Discharge: 2020-08-23 | Disposition: A | Discharge status: Home / Self Care | Payer: Medicare Other | Attending: Emergency Medicine | Admitting: Emergency Medicine

## 2020-08-23 ENCOUNTER — Other Ambulatory Visit: Payer: Self-pay

## 2020-08-23 ENCOUNTER — Encounter (HOSPITAL_COMMUNITY): Payer: Self-pay

## 2020-08-23 DIAGNOSIS — R21 Rash and other nonspecific skin eruption: Secondary | ICD-10-CM | POA: Diagnosis present

## 2020-08-23 DIAGNOSIS — F17210 Nicotine dependence, cigarettes, uncomplicated: Secondary | ICD-10-CM | POA: Insufficient documentation

## 2020-08-23 DIAGNOSIS — Z8673 Personal history of transient ischemic attack (TIA), and cerebral infarction without residual deficits: Secondary | ICD-10-CM | POA: Insufficient documentation

## 2020-08-23 DIAGNOSIS — I10 Essential (primary) hypertension: Secondary | ICD-10-CM | POA: Diagnosis not present

## 2020-08-23 DIAGNOSIS — Z79899 Other long term (current) drug therapy: Secondary | ICD-10-CM | POA: Diagnosis not present

## 2020-08-23 DIAGNOSIS — Z7982 Long term (current) use of aspirin: Secondary | ICD-10-CM | POA: Insufficient documentation

## 2020-08-23 DIAGNOSIS — B029 Zoster without complications: Secondary | ICD-10-CM | POA: Insufficient documentation

## 2020-08-23 MED ORDER — HYDROCODONE-ACETAMINOPHEN 5-325 MG PO TABS: 1.0000 | ORAL_TABLET | ORAL | 0 refills | Status: AC | PRN

## 2020-08-23 MED ORDER — ACYCLOVIR 800 MG PO TABS: 800.0000 mg | ORAL_TABLET | Freq: Every day | ORAL | 0 refills | Status: AC

## 2020-08-23 NOTE — ED Provider Notes (Signed)
[facility] Provider Note   CSN: [id number] Arrival date & time: 08/23/20  6314     History Chief Complaint  Patient presents with  . Rash    Thomas Willis is a 55 y.o. male.  Pt complains of a rash on his right leg.  Pt thinks he has shingles   The history is provided by the patient. No language interpreter was used.  Rash Quality: blistering and itchiness   Severity:  Moderate Onset quality:  Gradual Timing:  Constant Chronicity:  New Relieved by:  Nothing Worsened by:  Nothing Associated symptoms: no sore throat        Past Medical History:  Diagnosis Date  . Arthritis   . Back pain, chronic   . Blood disorder    LPC, treated at [facility]  . Hypertension   . Lumbar herniated disc    L4, L5  . Lupus ([place])   . Pancreatitis   . TIA (transient ischemic attack) 2016    Patient Active Problem List   Diagnosis Date Noted  . TIA (transient ischemic attack)   . CVA (cerebral infarction) 12/02/2014  . Left-sided weakness 11/30/2014  . Essential hypertension, benign 11/30/2014  . Tobacco abuse 11/30/2014  . Mixed connective tissue disease ([place]) 11/30/2014  . LEFT VENTRICULAR FUNCTION, DECREASED 01/01/2010  . OTH NONSPECIFIC ABNORM CV SYSTEM FUNCTION STUDY 01/01/2010    Past Surgical History:  Procedure Laterality Date  . COLONOSCOPY WITH PROPOFOL N/A 09/12/2014   Procedure: COLONOSCOPY WITH PROPOFOL;  Surgeon: Hulen Luster, MD;  Location: [facility] ENDOSCOPY;  Service: Gastroenterology;  Laterality: N/A;  . ESOPHAGOGASTRODUODENOSCOPY N/A 09/12/2014   Procedure: ESOPHAGOGASTRODUODENOSCOPY (EGD);  Surgeon: Hulen Luster, MD;  Location: [facility] ENDOSCOPY;  Service: Gastroenterology;  Laterality: N/A;  . HERNIA REPAIR    . LUMBAR EPIDURAL INJECTION         Family History  Problem Relation Age of Onset  . Hypertension Other   . CAD Other     Social History   Tobacco Use  . Smoking status: Light Tobacco Smoker    Types: Cigarettes  . Smokeless  tobacco: Never Used  Vaping Use  . Vaping Use: Never used  Substance Use Topics  . Alcohol use: No  . Drug use: No    Home Medications Prior to Admission medications   Medication Sig Start Date End Date Taking? Authorizing Provider  amLODipine (NORVASC) 5 MG tablet Take 5 mg by mouth at bedtime. 01/22/16   [provider]  aspirin 325 MG tablet Take 1 tablet (325 mg total) by mouth daily. Patient not taking: Reported on 02/12/2020 11/07/15   Molpus, John, MD  hydroxychloroquine (PLAQUENIL) 200 MG tablet Take 1 tablet by mouth 2 (two) times daily. 10/30/15   [provider]  leucovorin (WELLCOVORIN) 5 MG tablet Take 1 tablet by mouth every 12 (twelve) hours. 10/07/15   [provider]  lisinopril (ZESTRIL) 10 MG tablet Take by mouth. 09/17/15   [provider]  methotrexate 50 MG/2ML injection Inject 25 mg into the muscle every 7 (seven) days. 01/22/16   [provider]    Allergies    Ketoconazole  Review of Systems   Review of Systems  HENT: Negative for sore throat.   Skin: Positive for rash.  All other systems reviewed and are negative.   Physical Exam Updated Vital Signs BP 131/88 (BP Location: Right Arm)   Pulse 72   Temp 98.6 F (37 C) (Oral)   Resp 18  Ht 5\' 10"  (1.778 m)   Wt 86.2 kg   SpO2 100%   BMI 27.26 kg/m   Physical Exam Vitals and nursing note reviewed.  Constitutional:      Appearance: He is well-developed.  HENT:     Head: Normocephalic.  Cardiovascular:     Rate and Rhythm: Normal rate.  Pulmonary:     Effort: Pulmonary effort is normal.  Abdominal:     General: There is no distension.  Musculoskeletal:        General: Normal range of motion.     Cervical back: Normal range of motion.  Skin:    Findings: Rash present.     Comments: Linear blisters in dermatomal pattern right leg  Neurological:     Mental Status: He is alert and oriented to person, place, and time.  Psychiatric:        Mood and  Affect: Mood normal.     ED Results / Procedures / Treatments   Labs (all labs ordered are listed, but only abnormal results are displayed) Labs Reviewed - No data to display  EKG None  Radiology No results found.  Procedures Procedures   Medications Ordered in ED Medications - No data to display  ED Course  I have reviewed the triage vital signs and the nursing notes.  Pertinent labs & imaging results that were available during my care of the patient were reviewed by me and considered in my medical decision making (see chart for details).    MDM Rules/Calculators/A&P                           Final Clinical Impression(s) / ED Diagnoses Final diagnoses:  Herpes zoster without complication    Rx / DC Orders ED Discharge Orders         Ordered    acyclovir (ZOVIRAX) 800 MG tablet  5 times daily        08/23/20 0944    HYDROcodone-acetaminophen (NORCO/VICODIN) 5-325 MG tablet  Every 4 hours PRN        08/23/20 0944        An After Visit Summary was printed and given to the patient.   Fransico Meadow, [place] 08/23/20 2751    Margette Fast, MD 08/24/20 [phone]

## 2020-08-23 NOTE — ED Triage Notes (Signed)
Pt to er, pt states that he is has had a rash for the past week, states that it started on his upper leg and now is progressing down his leg, pt has red raised hive like rash on his R leg. States that it hurts to touch, and it itchy

## 2020-08-23 NOTE — Discharge Instructions (Signed)
See your Physicain for recheck this week

## 2020-09-26 DIAGNOSIS — Z20822 Contact with and (suspected) exposure to covid-19: Secondary | ICD-10-CM | POA: Diagnosis not present

## 2020-12-02 ENCOUNTER — Emergency Department (HOSPITAL_COMMUNITY): Payer: Medicare Other

## 2020-12-02 ENCOUNTER — Encounter (HOSPITAL_COMMUNITY): Payer: Self-pay | Admitting: *Deleted

## 2020-12-02 ENCOUNTER — Emergency Department (HOSPITAL_COMMUNITY)
Admission: EM | Admit: 2020-12-02 | Discharge: 2020-12-02 | Disposition: A | Discharge status: Home / Self Care | Payer: Medicare Other | Attending: Emergency Medicine | Admitting: Emergency Medicine

## 2020-12-02 ENCOUNTER — Other Ambulatory Visit: Payer: Self-pay

## 2020-12-02 DIAGNOSIS — F17210 Nicotine dependence, cigarettes, uncomplicated: Secondary | ICD-10-CM | POA: Insufficient documentation

## 2020-12-02 DIAGNOSIS — I10 Essential (primary) hypertension: Secondary | ICD-10-CM | POA: Insufficient documentation

## 2020-12-02 DIAGNOSIS — Z79899 Other long term (current) drug therapy: Secondary | ICD-10-CM | POA: Insufficient documentation

## 2020-12-02 DIAGNOSIS — R001 Bradycardia, unspecified: Secondary | ICD-10-CM | POA: Diagnosis not present

## 2020-12-02 DIAGNOSIS — G43909 Migraine, unspecified, not intractable, without status migrainosus: Secondary | ICD-10-CM | POA: Diagnosis not present

## 2020-12-02 DIAGNOSIS — R519 Headache, unspecified: Secondary | ICD-10-CM | POA: Diagnosis not present

## 2020-12-02 LAB — CBC
HCT: 46.2 % (ref 39.0–52.0)
Hemoglobin: 15.7 g/dL (ref 13.0–17.0)
MCH: 31.6 pg (ref 26.0–34.0)
MCHC: 34 g/dL (ref 30.0–36.0)
MCV: 93 fL (ref 80.0–100.0)
Platelets: 281 10*3/uL (ref 150–400)
RBC: 4.97 MIL/uL (ref 4.22–5.81)
RDW: 12.8 % (ref 11.5–15.5)
WBC: 7.6 10*3/uL (ref 4.0–10.5)
nRBC: 0 % (ref 0.0–0.2)

## 2020-12-02 LAB — BASIC METABOLIC PANEL
Anion gap: 8 (ref 5–15)
BUN: 11 mg/dL (ref 6–20)
CO2: 28 mmol/L (ref 22–32)
Calcium: 9.4 mg/dL (ref 8.9–10.3)
Chloride: 103 mmol/L (ref 98–111)
Creatinine, Ser: 1.03 mg/dL (ref 0.61–1.24)
GFR, Estimated: >60 mL/min (ref >60)
Glucose, Bld: 107 mg/dL — ABNORMAL HIGH (ref 70–99)
Potassium: 4 mmol/L (ref 3.5–5.1)
Sodium: 139 mmol/L (ref 135–145)

## 2020-12-02 MED ORDER — METOCLOPRAMIDE HCL 5 MG/ML IJ SOLN
10.0000 mg | Freq: Once | INTRAMUSCULAR | Status: AC
  Administered 2020-12-02: 10 mg via INTRAVENOUS
  Filled 2020-12-02: qty 2

## 2020-12-02 MED ORDER — KETOROLAC TROMETHAMINE 30 MG/ML IJ SOLN
30.0000 mg | Freq: Once | INTRAMUSCULAR | Status: AC
  Administered 2020-12-02: 30 mg via INTRAVENOUS
  Filled 2020-12-02: qty 1

## 2020-12-02 NOTE — ED Notes (Signed)
Pt to CT

## 2020-12-02 NOTE — ED Provider Notes (Signed)
[facility] EMERGENCY DEPARTMENT Provider Note  CSN: [id number] Arrival date & time: 12/02/20 1618    History Chief Complaint  Patient presents with   Headache    RAYQUAN VIAU is a 55 y.o. male with history of HTN, ?TIA, lupus on methotrexate and plaquenil, reports onset of headache earlier today while sitting at home. He reports some visual scotoma, nausea, vomiting, photophobia. He has had similar headaches in the past but has never seen a neurologist for same. He Was last seen for similar presentation in Nov 2021, CT was neg, headache resolved with Migraine Cocktail and he was ultimately discharged.    Past Medical History:  Diagnosis Date   Arthritis    Back pain, chronic    Blood disorder    LPC, treated at [facility]   Hypertension    Lumbar herniated disc    L4, L5   Lupus ([facility])    Pancreatitis    TIA (transient ischemic attack) 2016    Past Surgical History:  Procedure Laterality Date   COLONOSCOPY WITH PROPOFOL N/A 09/12/2014   Procedure: COLONOSCOPY WITH PROPOFOL;  Surgeon: Hulen Luster, MD;  Location: [facility] ENDOSCOPY;  Service: Gastroenterology;  Laterality: N/A;   ESOPHAGOGASTRODUODENOSCOPY N/A 09/12/2014   Procedure: ESOPHAGOGASTRODUODENOSCOPY (EGD);  Surgeon: Hulen Luster, MD;  Location: [facility] ENDOSCOPY;  Service: Gastroenterology;  Laterality: N/A;   HERNIA REPAIR     LUMBAR EPIDURAL INJECTION      Family History  Problem Relation Age of Onset   Hypertension Other    CAD Other     Social History   Tobacco Use   Smoking status: Light Smoker    Types: Cigarettes   Smokeless tobacco: Never  Vaping Use   Vaping Use: Never used  Substance Use Topics   Alcohol use: No   Drug use: No     Home Medications Prior to Admission medications   Medication Sig Start Date End Date Taking? Authorizing Provider  hydroxychloroquine (PLAQUENIL) 200 MG tablet Take 1 tablet by mouth 2 (two) times daily. 10/30/15  Yes [provider]  leucovorin (WELLCOVORIN) 5 MG  tablet Take 1 tablet by mouth every 12 (twelve) hours. 10/07/15  Yes [provider]  TREXALL 10 MG tablet Take 2.5 mg by mouth once a week. Sunday 08/01/20  Yes [provider]  acyclovir (ZOVIRAX) 800 MG tablet Take 1 tablet (800 mg total) by mouth 5 (five) times daily. Patient not taking: No sig reported 08/23/20   Fransico Meadow, PA-C  amLODipine (NORVASC) 5 MG tablet Take 5 mg by mouth at bedtime. Patient not taking: Reported on 12/02/2020 01/22/16   [provider]  aspirin 325 MG tablet Take 1 tablet (325 mg total) by mouth daily. Patient not taking: Reported on 02/12/2020 11/07/15   Molpus, John, MD  HYDROcodone-acetaminophen (NORCO/VICODIN) 5-325 MG tablet Take 1 tablet by mouth every 4 (four) hours as needed for moderate pain. Patient not taking: No sig reported 08/23/20 08/23/21  Caryl Ada K, PA-C  lisinopril (ZESTRIL) 10 MG tablet Take by mouth. Patient not taking: Reported on 12/02/2020 09/17/15   [provider]  lisinopril (ZESTRIL) 10 MG tablet Take 1 tablet by mouth daily. Patient not taking: No sig reported 09/17/15   [provider]  methotrexate 50 MG/2ML injection Inject 25 mg into the muscle every 7 (seven) days. Patient not taking: Reported on 12/02/2020 01/22/16   [provider]     Allergies    Ketoconazole   Review of Systems  Review of Systems A comprehensive review of systems was completed and negative except as noted in HPI.    Physical Exam BP (!) 141/84   Pulse (!) 51   Temp 97.8 F (36.6 C)   Resp 18   Ht '5\' 10"'$  (1.778 m)   Wt 83.9 kg   SpO2 97%   BMI 26.54 kg/m   Physical Exam Vitals and nursing note reviewed.  Constitutional:      Appearance: Normal appearance.  HENT:     Head: Normocephalic and atraumatic.     Nose: Nose normal.     Mouth/Throat:     Mouth: Mucous membranes are moist.  Eyes:     Extraocular Movements: Extraocular movements intact.     Conjunctiva/sclera: Conjunctivae  normal.  Cardiovascular:     Rate and Rhythm: Bradycardia present.  Pulmonary:     Effort: Pulmonary effort is normal.     Breath sounds: Normal breath sounds.  Abdominal:     General: Abdomen is flat.     Palpations: Abdomen is soft.     Tenderness: There is no abdominal tenderness.  Musculoskeletal:        General: No swelling. Normal range of motion.     Cervical back: Neck supple.  Skin:    General: Skin is warm and dry.  Neurological:     General: No focal deficit present.     Mental Status: He is alert.     Cranial Nerves: No cranial nerve deficit or facial asymmetry.     Sensory: No sensory deficit.     Motor: No weakness.  Psychiatric:        Mood and Affect: Mood normal.     ED Results / Procedures / Treatments   Labs (all labs ordered are listed, but only abnormal results are displayed) Labs Reviewed  BASIC METABOLIC PANEL - Abnormal; Notable for the following components:      Result Value   Glucose, Bld 107 (*)    All other components within normal limits  CBC  URINALYSIS, ROUTINE W REFLEX MICROSCOPIC    EKG EKG Interpretation  Date/Time:  Tuesday December 02 2020 18:01:34 EDT Ventricular Rate:  47 PR Interval:  196 QRS Duration: 120 QT Interval:  448 QTC Calculation: 396 R Axis:   -24 Text Interpretation: Sinus bradycardia with sinus arrhythmia Minimal voltage criteria for LVH, may be normal variant ( Cornell product ) Septal infarct , age undetermined T wave abnormality, consider lateral ischemia Abnormal ECG No significant change since last tracing Confirmed by Calvert Cantor [phone]) on 12/02/2020 6:13:11 PM  Radiology CT HEAD WO CONTRAST (5MM)  Result Date: 12/02/2020 CLINICAL DATA:  Headache and nausea. EXAM: CT HEAD WITHOUT CONTRAST TECHNIQUE: Contiguous axial images were obtained from the base of the skull through the vertex without intravenous contrast. COMPARISON:  CT head 02/12/2020.  MRI brain 11/07/2015. FINDINGS: Brain: No evidence of acute  infarction, hemorrhage, hydrocephalus, extra-axial collection or mass lesion/mass effect. Vascular: No hyperdense vessel or unexpected calcification. Skull: Normal. Negative for fracture or focal lesion. Sinuses/Orbits: No acute finding. Other: None. IMPRESSION: No acute intracranial abnormality. Electronically Signed   By: Ronney Asters M.D.   On: 12/02/2020 19:00    Procedures Procedures  Medications Ordered in the ED Medications  ketorolac (TORADOL) 30 MG/ML injection 30 mg (30 mg Intravenous Given 12/02/20 1915)  metoCLOPramide (REGLAN) injection 10 mg (10 mg Intravenous Given 12/02/20 1915)     MDM Rules/Calculators/A&P MDM Patient with recurrent migrainous type headache also noted to be  hypertensive on arrival. CT head is neg for acute process. Will give Toradol/Reglan, awaiting labs.   ED Course  I have reviewed the triage vital signs and the nursing notes.  Pertinent labs & imaging results that were available during my care of the patient were reviewed by me and considered in my medical decision making (see chart for details).  Clinical Course as of 12/02/20 2240  Tue Dec 02, 2020  1911 CBC is normal.  [CS]  1935 BMP is normal. No signs of end organ damage from HTN.  [CS]  2002 BP improving with headache cocktail.  [CS]  2239 Headache has resolved, BP is normalized, patient is ready to go home. Recommend outpatient neurology follow up for long term management.  [CS]    Clinical Course User Index [CS] Truddie Hidden, MD    Final Clinical Impression(s) / ED Diagnoses Final diagnoses:  Migraine without status migrainosus, not intractable, unspecified migraine type  Hypertension, unspecified type    Rx / DC Orders ED Discharge Orders     None        Truddie Hidden, MD 12/02/20 2240

## 2020-12-02 NOTE — ED Notes (Signed)
Pt states he has not had his BP meds in over a month due to no refill

## 2020-12-02 NOTE — ED Triage Notes (Signed)
Headache with nausea for the past 2 days, states he is taking chemo medication for lupus. States he took the medication 2 days ago and has had a headache ever since

## 2021-01-28 DIAGNOSIS — M2550 Pain in unspecified joint: Secondary | ICD-10-CM | POA: Diagnosis not present

## 2021-01-28 DIAGNOSIS — I10 Essential (primary) hypertension: Secondary | ICD-10-CM | POA: Diagnosis not present

## 2021-01-28 DIAGNOSIS — Z23 Encounter for immunization: Secondary | ICD-10-CM | POA: Diagnosis not present

## 2021-01-28 DIAGNOSIS — M5430 Sciatica, unspecified side: Secondary | ICD-10-CM | POA: Diagnosis not present

## 2021-01-28 DIAGNOSIS — M359 Systemic involvement of connective tissue, unspecified: Secondary | ICD-10-CM | POA: Diagnosis not present

## 2021-01-28 DIAGNOSIS — L411 Pityriasis lichenoides chronica: Secondary | ICD-10-CM | POA: Diagnosis not present

## 2022-08-26 DIAGNOSIS — M19042 Primary osteoarthritis, left hand: Secondary | ICD-10-CM | POA: Diagnosis not present

## 2022-08-26 DIAGNOSIS — M359 Systemic involvement of connective tissue, unspecified: Secondary | ICD-10-CM | POA: Diagnosis not present

## 2023-03-02 DIAGNOSIS — M359 Systemic involvement of connective tissue, unspecified: Secondary | ICD-10-CM | POA: Diagnosis not present

## 2023-03-02 DIAGNOSIS — L411 Pityriasis lichenoides chronica: Secondary | ICD-10-CM | POA: Diagnosis not present

## 2023-05-12 DIAGNOSIS — Z79899 Other long term (current) drug therapy: Secondary | ICD-10-CM | POA: Diagnosis not present

## 2023-05-12 DIAGNOSIS — H524 Presbyopia: Secondary | ICD-10-CM | POA: Diagnosis not present

## 2023-06-15 DIAGNOSIS — M351 Other overlap syndromes: Secondary | ICD-10-CM | POA: Diagnosis not present

## 2023-06-15 DIAGNOSIS — L411 Pityriasis lichenoides chronica: Secondary | ICD-10-CM | POA: Diagnosis not present

## 2023-06-15 DIAGNOSIS — M359 Systemic involvement of connective tissue, unspecified: Secondary | ICD-10-CM | POA: Diagnosis not present

## 2023-06-15 DIAGNOSIS — Z1159 Encounter for screening for other viral diseases: Secondary | ICD-10-CM | POA: Diagnosis not present

## 2023-06-15 DIAGNOSIS — Z7289 Other problems related to lifestyle: Secondary | ICD-10-CM | POA: Diagnosis not present

## 2023-06-15 DIAGNOSIS — Z79899 Other long term (current) drug therapy: Secondary | ICD-10-CM | POA: Diagnosis not present

## 2023-09-07 DIAGNOSIS — M359 Systemic involvement of connective tissue, unspecified: Secondary | ICD-10-CM | POA: Diagnosis not present

## 2023-09-07 DIAGNOSIS — L411 Pityriasis lichenoides chronica: Secondary | ICD-10-CM | POA: Diagnosis not present
